# Patient Record
Sex: Male | Born: 1945
Health system: Southern US, Community
[De-identification: ages and names within clinical notes are randomized; demographics above are authoritative.]

## PROBLEM LIST (undated history)

## (undated) DIAGNOSIS — K219 Gastro-esophageal reflux disease without esophagitis: Secondary | ICD-10-CM

## (undated) DIAGNOSIS — I10 Essential (primary) hypertension: Secondary | ICD-10-CM

## (undated) HISTORY — PX: NEPHRECTOMY: SHX65

---

## 2005-12-04 ENCOUNTER — Ambulatory Visit: Payer: Self-pay | Admitting: Internal Medicine

## 2005-12-07 ENCOUNTER — Ambulatory Visit: Payer: Self-pay | Admitting: Internal Medicine

## 2009-12-17 ENCOUNTER — Inpatient Hospital Stay: Payer: Self-pay | Admitting: Internal Medicine

## 2011-01-27 ENCOUNTER — Observation Stay: Payer: Self-pay | Admitting: Internal Medicine

## 2012-11-22 ENCOUNTER — Ambulatory Visit: Payer: Self-pay | Admitting: Internal Medicine

## 2013-09-07 ENCOUNTER — Emergency Department: Payer: Self-pay | Admitting: Emergency Medicine

## 2014-12-25 DIAGNOSIS — M79646 Pain in unspecified finger(s): Secondary | ICD-10-CM | POA: Diagnosis not present

## 2014-12-25 DIAGNOSIS — M109 Gout, unspecified: Secondary | ICD-10-CM | POA: Diagnosis not present

## 2015-05-19 IMAGING — CR CERVICAL SPINE - COMPLETE 4+ VIEW
1 series · 7 of 7 positions shown · non-contrast
Comparison: none

REASON FOR EXAM: neck pain
COMMENTS:

[Series 1: w cervical spine lat · 0.14mm/px · 7 of 7 slices shown]
[im 1/7]
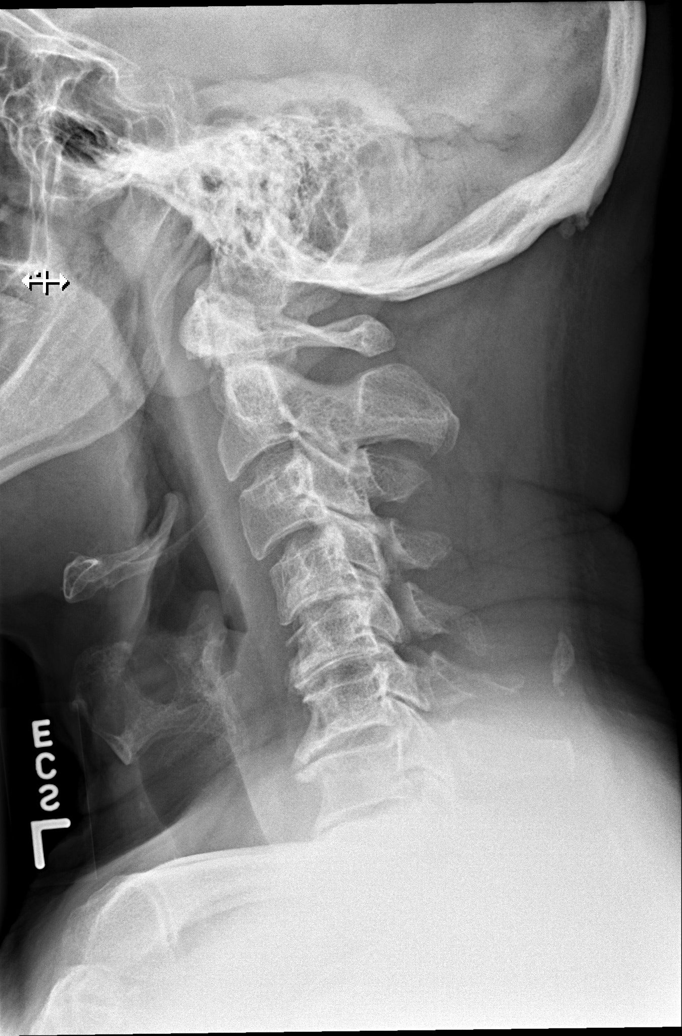
[im 2/7]
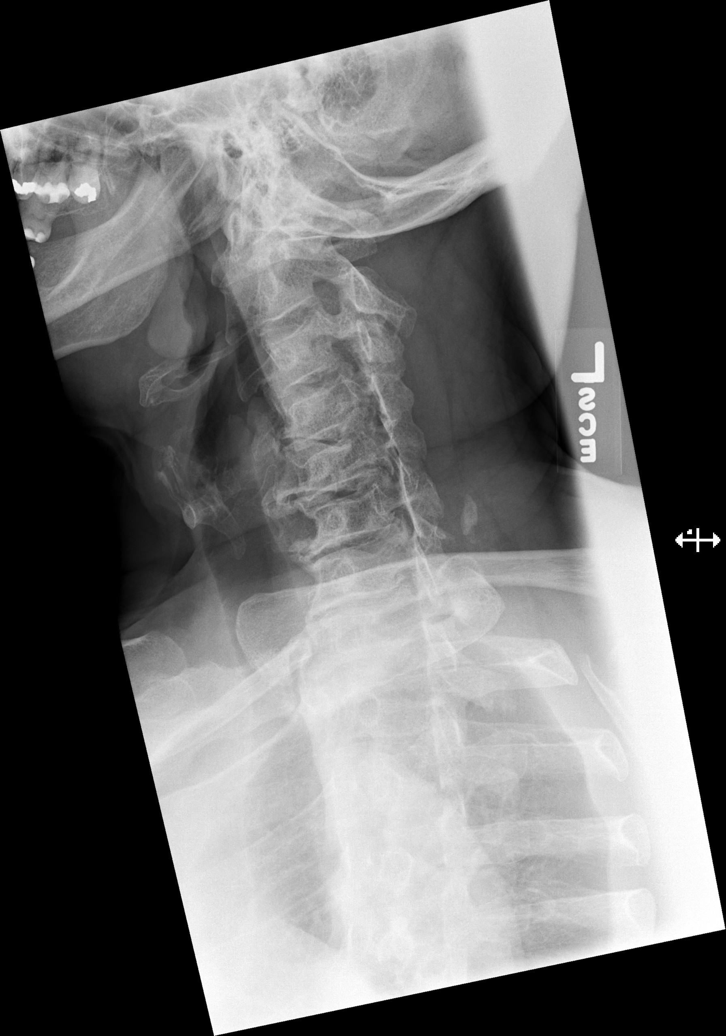
[im 3/7]
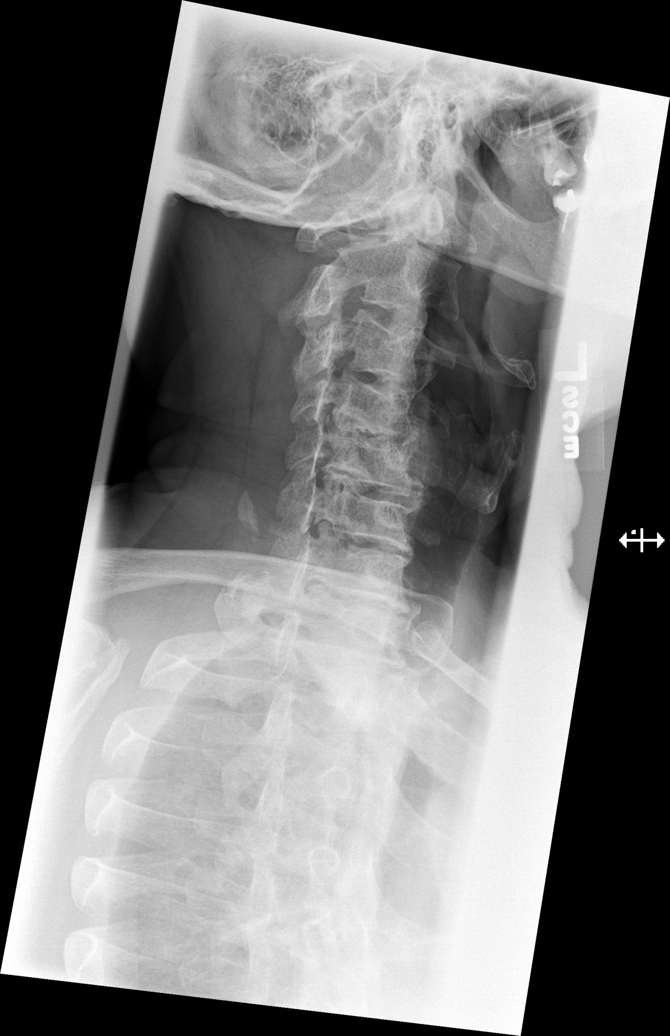
[im 4/7]
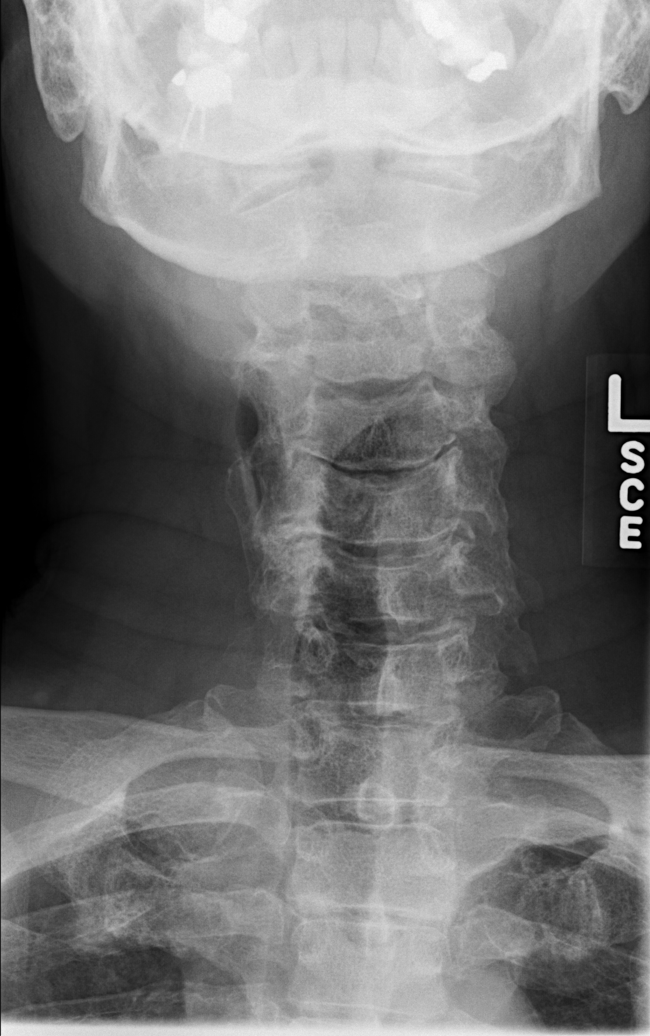
[im 5/7]
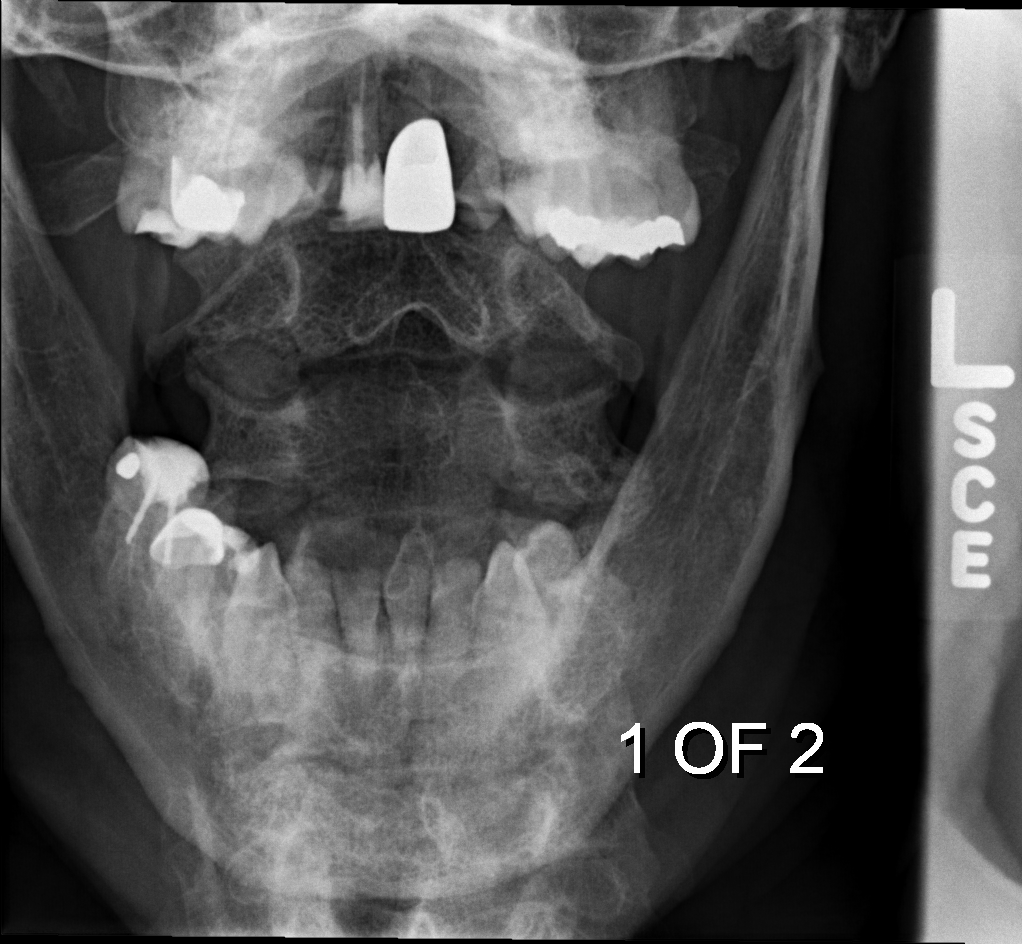
[im 6/7]
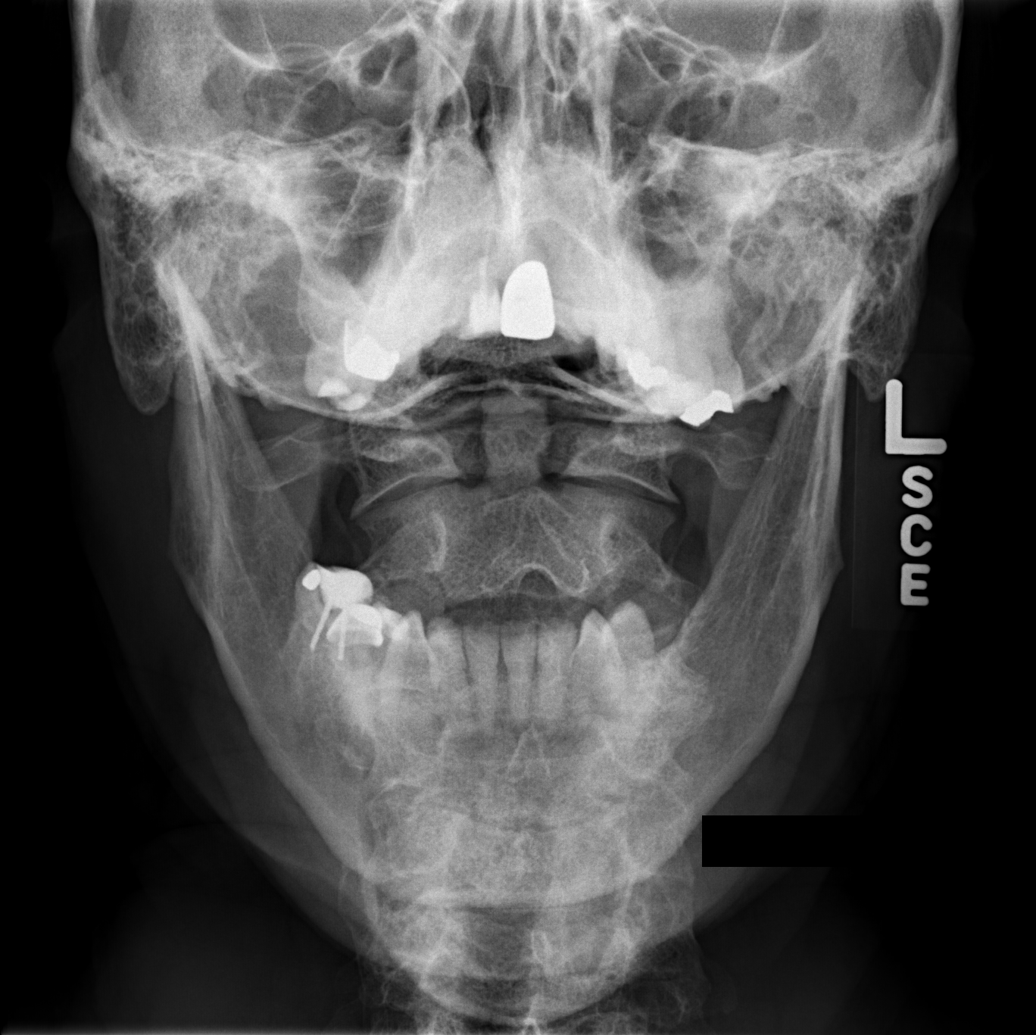
[im 7/7]
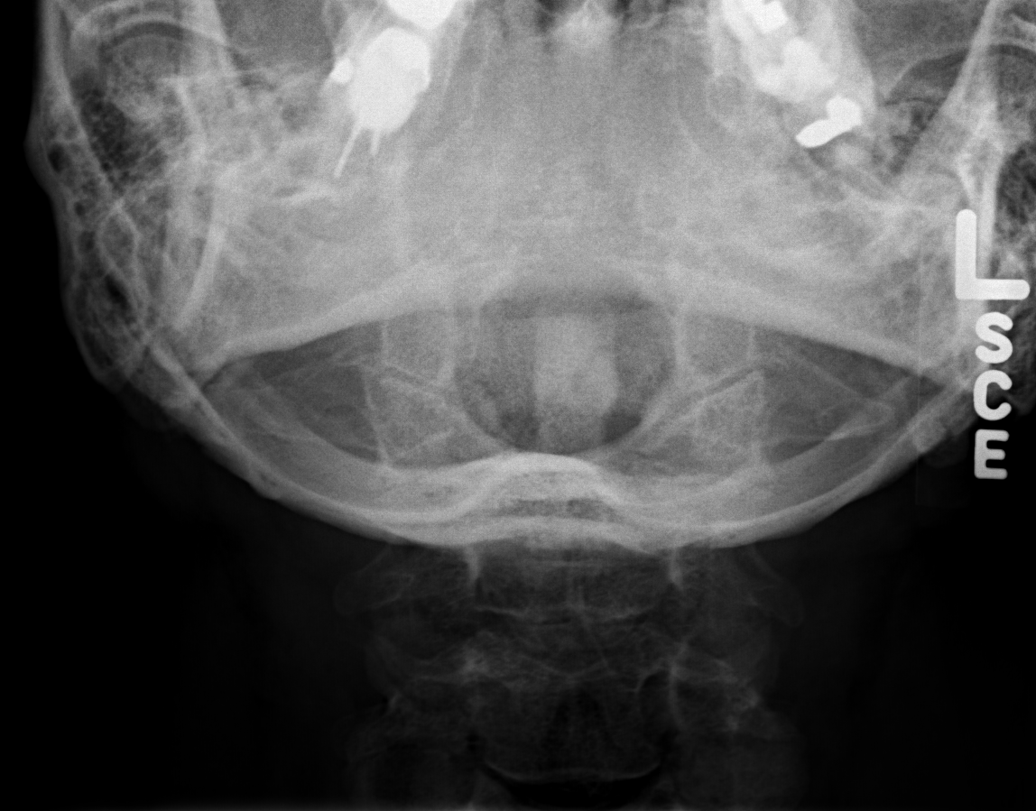

[7 of 7 positions shown; findings below may reference images not displayed]

PROCEDURE:     DXR - DXR CERVICAL SPINE COMPLETE  - November 22, 2012 [DATE]

RESULT:     There is loss of the normal cervical lordosis. There is
intervertebral disc narrowing at C5-C6 and C6-C7 as well as at C7-T1. There
is some soft tissue calcification posterior to the C6 level. The
prevertebral soft tissues are normal. Degenerative endplate spurring is
seen. Uncinate spurring is seen at multiple levels bilaterally causing
foraminal encroachment especially on the left at C4-C5 and C6. Facet
arthropathy is present. The odontoid is intact. The atlantoaxial alignment
is maintained.
IMPRESSION: Degenerative changes in the cervical spine with some bony
encroachment on the foramina. Loss of the normal cervical lordosis. No acute
bony abnormality evident.

[REDACTED]

## 2016-01-03 DIAGNOSIS — N529 Male erectile dysfunction, unspecified: Secondary | ICD-10-CM | POA: Diagnosis not present

## 2016-01-03 DIAGNOSIS — I10 Essential (primary) hypertension: Secondary | ICD-10-CM | POA: Diagnosis not present

## 2016-01-03 DIAGNOSIS — M47812 Spondylosis without myelopathy or radiculopathy, cervical region: Secondary | ICD-10-CM | POA: Diagnosis not present

## 2016-04-24 ENCOUNTER — Emergency Department
Admission: EM | Admit: 2016-04-24 | Discharge: 2016-04-24 | Disposition: A | Discharge status: Home / Self Care | Payer: Commercial Managed Care - HMO | Attending: Emergency Medicine | Admitting: Emergency Medicine

## 2016-04-24 ENCOUNTER — Emergency Department: Payer: Commercial Managed Care - HMO

## 2016-04-24 DIAGNOSIS — Y929 Unspecified place or not applicable: Secondary | ICD-10-CM | POA: Insufficient documentation

## 2016-04-24 DIAGNOSIS — I10 Essential (primary) hypertension: Secondary | ICD-10-CM | POA: Diagnosis not present

## 2016-04-24 DIAGNOSIS — Y9389 Activity, other specified: Secondary | ICD-10-CM | POA: Diagnosis not present

## 2016-04-24 DIAGNOSIS — Y999 Unspecified external cause status: Secondary | ICD-10-CM | POA: Diagnosis not present

## 2016-04-24 DIAGNOSIS — S46912A Strain of unspecified muscle, fascia and tendon at shoulder and upper arm level, left arm, initial encounter: Secondary | ICD-10-CM

## 2016-04-24 DIAGNOSIS — M25512 Pain in left shoulder: Secondary | ICD-10-CM | POA: Diagnosis not present

## 2016-04-24 DIAGNOSIS — X58XXXA Exposure to other specified factors, initial encounter: Secondary | ICD-10-CM | POA: Insufficient documentation

## 2016-04-24 DIAGNOSIS — S4992XA Unspecified injury of left shoulder and upper arm, initial encounter: Secondary | ICD-10-CM | POA: Diagnosis present

## 2016-04-24 HISTORY — DX: Essential (primary) hypertension: I10

## 2016-04-24 NOTE — ED Notes (Signed)
Patient reports changing a tire Friday and injuring left shoulder. Pt reports pain has been steadily increasing since initial injury. Patient also c/o decrease in range of motion of shoulder - patient reports he cannot lift arm past 30 degree angle

## 2016-04-24 NOTE — ED Notes (Signed)
MD Forbach at bedside. 

## 2016-04-24 NOTE — ED Notes (Signed)
Reviewed d/c instructions, follow-up care with patient. Pt verbalized understanding.  

## 2016-04-24 NOTE — ED Provider Notes (Signed)
[facility] Emergency Department Provider Note  ____________________________________________   First MD Initiated Contact with Patient 04/24/16 0543     (approximate)  I have reviewed the triage vital signs and the nursing notes.   HISTORY  Chief Complaint Shoulder Pain    HPI Adrian Blevins is a 70 y.o. male who presents for evaluation of acute pain in his left shoulder.  It started gradually about 2 days ago after he strained to move the lug nuts on a tire.  He did not have a particular moment where the pain began suddenly, but it is gradually gotten worse over the last 2 days.  He reports severe sharp pain when he attempts to raise his arm (abduct his shoulder).  He has no numbness nor tingling.  The pain does not radiate to his chest or back.  He had no specific trauma or injury.  He denies fever/chills, chest pain, shortness of breath, nausea, vomiting, abdominal pain.  He has normal use of his hand and his elbow with no pain in any other joint.   Past Medical History:  Diagnosis Date  . Hypertension     There are no active problems to display for this patient.   Past Surgical History:  Procedure Laterality Date  . NEPHRECTOMY Left     Prior to Admission medications   Not on File    Allergies Patient has no known allergies.  No family history on file.  Social History Social History  Substance Use Topics  . Smoking status: Not on file  . Smokeless tobacco: Not on file  . Alcohol use Not on file    Review of Systems Constitutional: No fever/chills Cardiovascular: Denies chest pain. Respiratory: Denies shortness of breath. Musculoskeletal: AcutNegative for back pain. Skin: Negative for rash. Neurological: Negative for headaches, focal weakness or numbness.   ____________________________________________   PHYSICAL EXAM:  VITAL SIGNS: ED Triage Vitals  Enc Vitals Group     BP 04/24/16 0445 (!) 148/88     Pulse Rate  04/24/16 0445 89     Resp 04/24/16 0445 16     Temp 04/24/16 0445 97.9 F (36.6 C)     Temp Source 04/24/16 0445 Oral     SpO2 04/24/16 0445 96 %     Weight 04/24/16 0443 205 lb (93 kg)     Height 04/24/16 0443 5\' 8"  (1.727 m)     Head Circumference --      Peak Flow --      Pain Score 04/24/16 0444 8     Pain Loc --      Pain Edu? --      Excl. in GC? --     Constitutional: Alert and oriented. Well appearing and in no acute distress. Head: Atraumatic. Mouth/Throat: Mucous membranes are moist.  Oropharynx non-erythematous. Cardiovascular: Normal rate, regular rhythm. Good peripheral circulation.  Respiratory: Normal respiratory effort.  No retractions.  Gastrointestinal: Soft and nontender. No distention.  Musculoskeletal: No gross deformity of his left upper extremity.  He has point tenderness to palpation of the left deltoid and around the [facility] joint.  No edema/effusion.  Patient has normal flexion/extension of elbow, no neurological deficits, good grip strength.  Able to reach across to right shoulder with minimal pain.  Pain is specific to shoulder abduction. Skin:  Skin is warm, dry and intact. No rash noted. Psychiatric: Mood and affect are normal. Speech and behavior are normal.  ____________________________________________   LABS (all labs ordered are listed,  but only abnormal results are displayed)  Labs Reviewed - No data to display ____________________________________________  EKG  No EKG ordered ____________________________________________  RADIOLOGY   Dg Shoulder Left  Result Date: 04/24/2016 CLINICAL DATA:  Left shoulder pain EXAM: LEFT SHOULDER - 2+ VIEW COMPARISON:  None. FINDINGS: There is no acute fracture or dislocation of the left shoulder. There is osteophytosis of the acromioclavicular joint. The glenohumeral joint is approximated. No focal bone lesions. IMPRESSION: 1. No acute fracture or dislocation of the left shoulder. 2. Acromioclavicular  osteoarthrosis. Electronically Signed   By: Deatra RobinsonKevin  Herman M.D.   On: 04/24/2016 05:32    ____________________________________________   PROCEDURES  Procedure(s) performed:   Procedures   Critical Care performed: No ____________________________________________   INITIAL IMPRESSION / ASSESSMENT AND PLAN / ED COURSE  Pertinent labs & imaging results that were available during my care of the patient were reviewed by me and considered in my medical decision making (see chart for details).  Sling for comfort but encouraged ROM as tolerated.  NSAIDs, heat therapy, ortho follow up    ____________________________________________  FINAL CLINICAL IMPRESSION(S) / ED DIAGNOSES  Final diagnoses:  Strain of left shoulder, initial encounter     MEDICATIONS GIVEN DURING THIS VISIT:  Medications - No data to display   NEW OUTPATIENT MEDICATIONS STARTED DURING THIS VISIT:  New Prescriptions   No medications on file    Modified Medications   No medications on file    Discontinued Medications   No medications on file     Note:  This document was prepared using Dragon voice recognition software and may include unintentional dictation errors.    Loleta Roseory Alishia Lebo, MD 04/24/16 [phone]

## 2016-04-24 NOTE — ED Triage Notes (Signed)
Patient presents with left shoulder pain. States he is unsure if it's a rotator cuff injury or not. Had changed a tire on Friday and started hurting a little while after that. Tonight states he cannot raise his arm due to pain.

## 2016-04-24 NOTE — Discharge Instructions (Signed)
As we discussed, your x-rays are reassuring, but you may have a strain or sprain of your shoulder.  We provided you with a sling, but encourage you use the arm as much as you are able.  Read through the included information about other recommendations including heat therapy.  Follow up with Dr. Ernest PineHooten or one of his colleagues in about a week if your shoulder has not improved.  Take over-the-counter ibuprofen and Tylenol as needed for pain.

## 2016-06-08 DIAGNOSIS — I10 Essential (primary) hypertension: Secondary | ICD-10-CM | POA: Diagnosis not present

## 2016-06-08 DIAGNOSIS — N183 Chronic kidney disease, stage 3 (moderate): Secondary | ICD-10-CM | POA: Diagnosis not present

## 2016-06-08 DIAGNOSIS — R809 Proteinuria, unspecified: Secondary | ICD-10-CM | POA: Diagnosis not present

## 2016-06-08 DIAGNOSIS — R600 Localized edema: Secondary | ICD-10-CM | POA: Diagnosis not present

## 2016-08-01 ENCOUNTER — Encounter: Payer: Self-pay | Admitting: *Deleted

## 2016-08-01 ENCOUNTER — Emergency Department: Payer: Medicare HMO

## 2016-08-01 ENCOUNTER — Inpatient Hospital Stay
Admission: EM | Admit: 2016-08-01 | Discharge: 2016-08-03 | DRG: 281 | Disposition: A | Discharge status: Home / Self Care | Payer: Medicare HMO | Attending: Specialist | Admitting: Specialist

## 2016-08-01 DIAGNOSIS — K219 Gastro-esophageal reflux disease without esophagitis: Secondary | ICD-10-CM | POA: Diagnosis not present

## 2016-08-01 DIAGNOSIS — Z7982 Long term (current) use of aspirin: Secondary | ICD-10-CM | POA: Diagnosis not present

## 2016-08-01 DIAGNOSIS — N183 Chronic kidney disease, stage 3 (moderate): Secondary | ICD-10-CM | POA: Diagnosis not present

## 2016-08-01 DIAGNOSIS — I209 Angina pectoris, unspecified: Secondary | ICD-10-CM | POA: Diagnosis not present

## 2016-08-01 DIAGNOSIS — Z23 Encounter for immunization: Secondary | ICD-10-CM

## 2016-08-01 DIAGNOSIS — Z8249 Family history of ischemic heart disease and other diseases of the circulatory system: Secondary | ICD-10-CM

## 2016-08-01 DIAGNOSIS — R0789 Other chest pain: Secondary | ICD-10-CM | POA: Diagnosis not present

## 2016-08-01 DIAGNOSIS — Z85528 Personal history of other malignant neoplasm of kidney: Secondary | ICD-10-CM | POA: Diagnosis not present

## 2016-08-01 DIAGNOSIS — R079 Chest pain, unspecified: Secondary | ICD-10-CM | POA: Diagnosis present

## 2016-08-01 DIAGNOSIS — I129 Hypertensive chronic kidney disease with stage 1 through stage 4 chronic kidney disease, or unspecified chronic kidney disease: Secondary | ICD-10-CM | POA: Diagnosis not present

## 2016-08-01 DIAGNOSIS — Z6831 Body mass index (BMI) 31.0-31.9, adult: Secondary | ICD-10-CM

## 2016-08-01 DIAGNOSIS — E669 Obesity, unspecified: Secondary | ICD-10-CM | POA: Diagnosis present

## 2016-08-01 DIAGNOSIS — Z905 Acquired absence of kidney: Secondary | ICD-10-CM | POA: Diagnosis not present

## 2016-08-01 DIAGNOSIS — N184 Chronic kidney disease, stage 4 (severe): Secondary | ICD-10-CM | POA: Diagnosis not present

## 2016-08-01 DIAGNOSIS — N1330 Unspecified hydronephrosis: Secondary | ICD-10-CM | POA: Diagnosis not present

## 2016-08-01 DIAGNOSIS — N179 Acute kidney failure, unspecified: Secondary | ICD-10-CM | POA: Diagnosis present

## 2016-08-01 DIAGNOSIS — E876 Hypokalemia: Secondary | ICD-10-CM | POA: Diagnosis present

## 2016-08-01 DIAGNOSIS — I10 Essential (primary) hypertension: Secondary | ICD-10-CM | POA: Diagnosis present

## 2016-08-01 DIAGNOSIS — I160 Hypertensive urgency: Secondary | ICD-10-CM | POA: Diagnosis present

## 2016-08-01 DIAGNOSIS — I214 Non-ST elevation (NSTEMI) myocardial infarction: Secondary | ICD-10-CM | POA: Diagnosis present

## 2016-08-01 HISTORY — DX: Gastro-esophageal reflux disease without esophagitis: K21.9

## 2016-08-01 LAB — CBC
HCT: 45.7 % (ref 40.0–52.0)
HEMOGLOBIN: 15.7 g/dL (ref 13.0–18.0)
MCH: 31.1 pg (ref 26.0–34.0)
MCHC: 34.3 g/dL (ref 32.0–36.0)
MCV: 90.8 fL (ref 80.0–100.0)
Platelets: 178 10*3/uL (ref 150–440)
RBC: 5.03 MIL/uL (ref 4.40–5.90)
RDW: 15.2 % — ABNORMAL HIGH (ref 11.5–14.5)
WBC: 8.8 10*3/uL (ref 3.8–10.6)

## 2016-08-01 LAB — BASIC METABOLIC PANEL
ANION GAP: 10 (ref 5–15)
BUN: 33 mg/dL — ABNORMAL HIGH (ref 6–20)
CALCIUM: 9.5 mg/dL (ref 8.9–10.3)
CO2: 28 mmol/L (ref 22–32)
Chloride: 101 mmol/L (ref 101–111)
Creatinine, Ser: 3.28 mg/dL — ABNORMAL HIGH (ref 0.61–1.24)
GFR, EST AFRICAN AMERICAN: 21 mL/min — AB (ref >60)
GFR, EST NON AFRICAN AMERICAN: 18 mL/min — AB (ref >60)
GLUCOSE: 118 mg/dL — AB (ref 65–99)
POTASSIUM: 3.2 mmol/L — AB (ref 3.5–5.1)
SODIUM: 139 mmol/L (ref 135–145)

## 2016-08-01 LAB — TROPONIN I

## 2016-08-01 MED ORDER — LABETALOL HCL 5 MG/ML IV SOLN
5.0000 mg | Freq: Once | INTRAVENOUS | Status: AC
  Administered 2016-08-01: 5 mg via INTRAVENOUS

## 2016-08-01 MED ORDER — GI COCKTAIL ~~LOC~~
30.0000 mL | Freq: Once | ORAL | Status: AC
  Administered 2016-08-01: 30 mL via ORAL
  Filled 2016-08-01: qty 30

## 2016-08-01 MED ORDER — LABETALOL HCL 5 MG/ML IV SOLN
5.0000 mg | Freq: Once | INTRAVENOUS | Status: AC
  Administered 2016-08-01: 5 mg via INTRAVENOUS
  Filled 2016-08-01: qty 4

## 2016-08-01 NOTE — ED Notes (Signed)
Patient transported to X-ray 

## 2016-08-01 NOTE — ED Provider Notes (Signed)
[facility] Emergency Department Provider Note    First MD Initiated Contact with Patient 08/01/16 2116     (approximate)  I have reviewed the triage vital signs and the nursing notes.   HISTORY  Chief Complaint Chest Pain    HPI Adrian Blevins is a 71 y.o. male with a history of high blood pressure as well as 1 time nephrectomy presents with worsening epigastric burning pain. This started roughly around eating dinner tonight.  Describes it as a burning pressure and sensation. Denies any shortness of breath. Took some Mylanta with some improvement. States he's never had a significant cardiac workup before. Not currently on dialysis. Denies any worsening orthopnea. No cough or fevers.   Past Medical History:  Diagnosis Date  . GERD (gastroesophageal reflux disease)   . Hypertension    Family History  Problem Relation Age of Onset  . Hypertension Other    Past Surgical History:  Procedure Laterality Date  . NEPHRECTOMY Left    Patient Active Problem List   Diagnosis Date Noted  . Chest pain 08/01/2016  . Accelerated hypertension 08/01/2016  . GERD (gastroesophageal reflux disease) 08/01/2016  . AKI (acute kidney injury) (HCC) 08/01/2016      Prior to Admission medications   Medication Sig Start Date End Date Taking? Authorizing Provider  aspirin EC 81 MG tablet Take 81 mg by mouth daily.   Yes Historical Provider, MD  Multiple Vitamin (MULTIVITAMIN WITH MINERALS) TABS tablet Take 1 tablet by mouth daily.   Yes Historical Provider, MD  NIFEdipine (PROCARDIA XL/ADALAT-CC) 90 MG 24 hr tablet Take 90 mg by mouth daily.   Yes Historical Provider, MD  ramipril (ALTACE) 10 MG capsule Take 10 mg by mouth daily.   Yes Historical Provider, MD    Allergies Patient has no known allergies.    Social History Social History  Substance Use Topics  . Smoking status: Never Smoker  . Smokeless tobacco: Never Used  . Alcohol use No    Review of  Systems Patient denies headaches, rhinorrhea, blurry vision, numbness, shortness of breath, chest pain, edema, cough, abdominal pain, nausea, vomiting, diarrhea, dysuria, fevers, rashes or hallucinations unless otherwise stated above in HPI. ____________________________________________   PHYSICAL EXAM:  VITAL SIGNS: Vitals:   08/01/16 2240 08/01/16 2250  BP: (!) 197/99 (!) 188/95  Pulse: 80 83  Resp: 16 12  Temp:      Constitutional: Alert and oriented. Well appearing and in no acute distress. Eyes: Conjunctivae are normal. PERRL. EOMI. Head: Atraumatic. Nose: No congestion/rhinnorhea. Mouth/Throat: Mucous membranes are moist.  Oropharynx non-erythematous. Neck: No stridor. Painless ROM. No cervical spine tenderness to palpation Hematological/Lymphatic/Immunilogical: No cervical lymphadenopathy. Cardiovascular: Normal rate, regular rhythm. Grossly normal heart sounds.  Good peripheral circulation. Respiratory: Normal respiratory effort.  No retractions. Lungs CTAB. Gastrointestinal: Soft and nontender. No distention. No abdominal bruits. No CVA tenderness. Genitourinary:  Musculoskeletal: No lower extremity tenderness nor edema.  No joint effusions. Neurologic:  Normal speech and language. No gross focal neurologic deficits are appreciated. No gait instability. Skin:  Skin is warm, dry and intact. No rash noted. Psychiatric: Mood and affect are normal. Speech and behavior are normal.  ____________________________________________   LABS (all labs ordered are listed, but only abnormal results are displayed)  Results for orders placed or performed during the hospital encounter of 08/01/16 (from the past 24 hour(s))  Basic metabolic panel     Status: Abnormal   Collection Time: 08/01/16  8:59 PM  Result Value  Ref Range   Sodium 139 135 - 145 mmol/L   Potassium 3.2 (L) 3.5 - 5.1 mmol/L   Chloride 101 101 - 111 mmol/L   CO2 28 22 - 32 mmol/L   Glucose, Bld 118 (H) 65 - 99 mg/dL    BUN 33 (H) 6 - 20 mg/dL   Creatinine, Ser 9.60 (H) 0.61 - 1.24 mg/dL   Calcium 9.5 8.9 - 45.4 mg/dL   GFR calc non Af Amer 18 (L) >60 mL/min   GFR calc Af Amer 21 (L) >60 mL/min   Anion gap 10 5 - 15  CBC     Status: Abnormal   Collection Time: 08/01/16  8:59 PM  Result Value Ref Range   WBC 8.8 3.8 - 10.6 K/uL   RBC 5.03 4.40 - 5.90 MIL/uL   Hemoglobin 15.7 13.0 - 18.0 g/dL   HCT 09.8 11.9 - 14.7 %   MCV 90.8 80.0 - 100.0 fL   MCH 31.1 26.0 - 34.0 pg   MCHC 34.3 32.0 - 36.0 g/dL   RDW 82.9 (H) 56.2 - 13.0 %   Platelets 178 150 - 440 K/uL  Troponin I     Status: None   Collection Time: 08/01/16  8:59 PM  Result Value Ref Range   Troponin I <0.03 <0.03 ng/mL   ____________________________________________  EKG My review and personal interpretation at Time: 20:51   Indication: chest pain  Rate: 98  Rhythm: sinus Axis: normal Other: non specific st changes, down sloping st depressions in inferolateral leads. no STEMI ____________________________________________  RADIOLOGY  I personally reviewed all radiographic images ordered to evaluate for the above acute complaints and reviewed radiology reports and findings.  These findings were personally discussed with the patient.  Please see medical record for radiology report.  ____________________________________________   PROCEDURES  Procedure(s) performed:  Procedures    Critical Care performed: no ____________________________________________   INITIAL IMPRESSION / ASSESSMENT AND PLAN / ED COURSE  Pertinent labs & imaging results that were available during my care of the patient were reviewed by me and considered in my medical decision making (see chart for details).  ACS, pericarditis, esophagitis, boerhaaves, pe, dissection, pna, bronchitis, costochondritis   TOA MIA is a 71 y.o. who presents to the ED with hypertension as well as burning chest pain and epigastric pain. Patient's presentation complicated  by a solitary kidney. Chest pain is not reproducible. EKG was nonspecific changes and initially troponin is negative. His abdominal exam is soft and benign. Do not feel this is consistent with evidence of Boerhaave's, pneumonia or dissection. Patient with some improvement after GI cocktail however given his marked hypertension requiring multiple doses of IV antihypertensives I am concerned for underlying anginal clumping his epigastric pain. States that he's had cardiac workup but this was several years ago as an outpatient. Patient is at high risk for coronary disease therefore do feel it would be prudent to admit the patient for serial enzymes and further risk stratification. This also provide time to manage his blood pressure. States his kidney function is typically around 37 may not be acutely worsening.  Have discussed with the patient and available family all diagnostics and treatments performed thus far and all questions were answered to the best of my ability. The patient demonstrates understanding and agreement with plan.     ____________________________________________   FINAL CLINICAL IMPRESSION(S) / ED DIAGNOSES  Final diagnoses:  Atypical chest pain  Hypertensive urgency      NEW MEDICATIONS STARTED DURING  THIS VISIT:  New Prescriptions   No medications on file     Note:  This document was prepared using Dragon voice recognition software and may include unintentional dictation errors.    Willy Eddy, MD 08/01/16 2350

## 2016-08-01 NOTE — H&P (Signed)
[facility] [place] [facility]   PATIENT NAME: Adrian Blevins    MR#:  [id number]  DATE OF BIRTH:  19-Nov-1945  DATE OF ADMISSION:  08/01/2016  PRIMARY CARE PHYSICIAN: Adrian PillarSyed A Masood, MD   REQUESTING/REFERRING PHYSICIAN: Roxan Hockeyobinson, MD  CHIEF COMPLAINT:   Chief Complaint  Patient presents with  . Chest Pain    HISTORY OF PRESENT ILLNESS:  Adrian Blevins  is a 71 y.o. male who presents with An episode of chest pain and significantly elevated blood pressure. Patient states that he had an episode of central chest pain, nonradiating, not related with exertion, and self-limiting. He has not had any prior workup for heart disease and has no prior history of the same. He does have an extensive history of uncontrolled hypertension. Tonight his blood pressure was in the 210s systolic in the ED. Hospitalists were called for admission  PAST MEDICAL HISTORY:   Past Medical History:  Diagnosis Date  . GERD (gastroesophageal reflux disease)   . Hypertension     PAST SURGICAL HISTORY:   Past Surgical History:  Procedure Laterality Date  . NEPHRECTOMY Left     SOCIAL HISTORY:   Social History  Substance Use Topics  . Smoking status: Never Smoker  . Smokeless tobacco: Never Used  . Alcohol use No    FAMILY HISTORY:   Family History  Problem Relation Age of Onset  . Hypertension Other     DRUG ALLERGIES:  No Known Allergies  MEDICATIONS AT HOME:   Prior to Admission medications   Medication Sig Start Date End Date Taking? Authorizing Provider  NIFEdipine (PROCARDIA XL/ADALAT-CC) 90 MG 24 hr tablet Take 90 mg by mouth daily.   Yes Historical Provider, MD  ramipril (ALTACE) 10 MG capsule Take 10 mg by mouth daily.   Yes Historical Provider, MD    REVIEW OF SYSTEMS:  Review of Systems  Constitutional: Negative for chills, fever, malaise/fatigue and weight loss.  HENT: Negative for ear pain, hearing loss and tinnitus.   Eyes: Negative for blurred  vision, double vision, pain and redness.  Respiratory: Negative for cough, hemoptysis and shortness of breath.   Cardiovascular: Positive for chest pain. Negative for palpitations, orthopnea and leg swelling.  Gastrointestinal: Negative for abdominal pain, constipation, diarrhea, nausea and vomiting.  Genitourinary: Negative for dysuria, frequency and hematuria.  Musculoskeletal: Negative for back pain, joint pain and neck pain.  Skin:       No acne, rash, or lesions  Neurological: Negative for dizziness, tremors, focal weakness and weakness.  Endo/Heme/Allergies: Negative for polydipsia. Does not bruise/bleed easily.  Psychiatric/Behavioral: Negative for depression. The patient is not nervous/anxious and does not have insomnia.      VITAL SIGNS:   Vitals:   08/01/16 2220 08/01/16 2230 08/01/16 2240 08/01/16 2250  BP: (!) 200/103 (!) 199/102 (!) 197/99 (!) 188/95  Pulse: 78 81 80 83  Resp: 18 17 16 12   Temp:      TempSrc:      SpO2: 94% 95% 96% 96%  Weight:      Height:       Wt Readings from Last 3 Encounters:  08/01/16 93 kg (205 lb)  04/24/16 93 kg (205 lb)    PHYSICAL EXAMINATION:  Physical Exam  Vitals reviewed. Constitutional: He is oriented to person, place, and time. He appears well-developed and well-nourished. No distress.  HENT:  Head: Normocephalic and atraumatic.  Mouth/Throat: Oropharynx is clear and moist.  Eyes: Conjunctivae and EOM are normal. Pupils  are equal, round, and reactive to light. No scleral icterus.  Neck: Normal range of motion. Neck supple. No JVD present. No thyromegaly present.  Cardiovascular: Normal rate, regular rhythm and intact distal pulses.  Exam reveals no gallop and no friction rub.   No murmur heard. Respiratory: Effort normal and breath sounds normal. No respiratory distress. He has no wheezes. He has no rales.  GI: Soft. Bowel sounds are normal. He exhibits no distension. There is no tenderness.  Musculoskeletal: Normal range of  motion. He exhibits no edema.  No arthritis, no gout  Lymphadenopathy:    He has no cervical adenopathy.  Neurological: He is alert and oriented to person, place, and time. No cranial nerve deficit.  No dysarthria, no aphasia  Skin: Skin is warm and dry. No rash noted. No erythema.  Psychiatric: He has a normal mood and affect. His behavior is normal. Judgment and thought content normal.    LABORATORY PANEL:   CBC  Recent Labs Lab 08/01/16 2059  WBC 8.8  HGB 15.7  HCT 45.7  PLT 178   ------------------------------------------------------------------------------------------------------------------  Chemistries   Recent Labs Lab 08/01/16 2059  NA 139  K 3.2*  CL 101  CO2 28  GLUCOSE 118*  BUN 33*  CREATININE 3.28*  CALCIUM 9.5   ------------------------------------------------------------------------------------------------------------------  Cardiac Enzymes  Recent Labs Lab 08/01/16 2059  TROPONINI <0.03   ------------------------------------------------------------------------------------------------------------------  RADIOLOGY:  Dg Chest 2 View  Result Date: 08/01/2016 CLINICAL DATA:  Midsternal chest pain. EXAM: CHEST  2 VIEW COMPARISON:  01/27/2011 FINDINGS: Unchanged heart size and mediastinal contours with thoracic aortic tortuosity. No pulmonary edema. No consolidation, pleural fluid or pneumothorax. Probable scarring anteriorly in the lingula. Minimal bronchial thickening. No acute osseous abnormalities are seen. IMPRESSION: Bronchial thickening, may be infectious or inflammatory. Electronically Signed   By: Rubye Oaks M.D.   On: 08/01/2016 21:37    EKG:   Orders placed or performed during the hospital encounter of 08/01/16  . EKG 12-Lead  . EKG 12-Lead  . ED EKG within 10 minutes  . ED EKG within 10 minutes    IMPRESSION AND PLAN:  Principal Problem:   Chest pain - we will trend his cardiac enzymes tonight, and get an echocardiogram and  cardiology consult in the morning. Active Problems:   Accelerated hypertension - blood pressure has been reduced initially by about 20% in the ED. We will continue with antihypertensives with a goal of reducing his blood pressure to less than 160/100 over the next 24 hours.   AKI (acute kidney injury) (HCC) - unclear if there is a chronic component to this renal disease, but his creatinine is greater than 3 tonight in the ED. We will consult nephrology.   GERD (gastroesophageal reflux disease) - home dose PPI  All the records are reviewed and case discussed with ED provider. Management plans discussed with the patient and/or family.  DVT PROPHYLAXIS: SubQ heparin  GI PROPHYLAXIS: PPI  ADMISSION STATUS: Observation  CODE STATUS: Full Code Status History    This patient does not have a recorded code status. Please follow your organizational policy for patients in this situation.      TOTAL TIME TAKING CARE OF THIS PATIENT: 40 minutes.    Lola Lofaro FIELDING 08/01/2016, 11:45 PM  [facility]  Office  [phone]  CC: Primary care physician; Adrian Pillar, MD

## 2016-08-01 NOTE — ED Triage Notes (Signed)
Pt states hx of acid reflux, does not take meds daily for this. Pt took a Nexium PO OTC tonight prior to eating tonight. Pt states "a little burning" but denies onset of chest pain. Pt presents w/ c/o mid-substernal chest that is not reproducible. Pt c/o increased belching. Pt denies associated sxs, is hypertensive in triage.

## 2016-08-01 NOTE — ED Notes (Addendum)
Epigastric chest pain that began this afternoon while at rest. Hx of reflux, pt took nexium PTA without much relief. Pt alert and oriented X4, active, cooperative, pt in NAD. RR even and unlabored, color WNL.   Pt hx of hypertension, no recent change in medications. Pt compliant with medications.

## 2016-08-01 NOTE — ED Notes (Signed)
EDP informed of repeat VS after labetalol given.

## 2016-08-02 ENCOUNTER — Observation Stay
Admit: 2016-08-02 | Discharge: 2016-08-02 | Disposition: A | Discharge status: Home / Self Care | Payer: Medicare HMO | Attending: Internal Medicine | Admitting: Internal Medicine

## 2016-08-02 ENCOUNTER — Observation Stay: Payer: Medicare HMO

## 2016-08-02 DIAGNOSIS — Z7982 Long term (current) use of aspirin: Secondary | ICD-10-CM | POA: Diagnosis not present

## 2016-08-02 DIAGNOSIS — I214 Non-ST elevation (NSTEMI) myocardial infarction: Secondary | ICD-10-CM | POA: Diagnosis present

## 2016-08-02 DIAGNOSIS — Z6831 Body mass index (BMI) 31.0-31.9, adult: Secondary | ICD-10-CM | POA: Diagnosis not present

## 2016-08-02 DIAGNOSIS — N1330 Unspecified hydronephrosis: Secondary | ICD-10-CM | POA: Diagnosis not present

## 2016-08-02 DIAGNOSIS — Z8249 Family history of ischemic heart disease and other diseases of the circulatory system: Secondary | ICD-10-CM | POA: Diagnosis not present

## 2016-08-02 DIAGNOSIS — K219 Gastro-esophageal reflux disease without esophagitis: Secondary | ICD-10-CM | POA: Diagnosis present

## 2016-08-02 DIAGNOSIS — R0789 Other chest pain: Secondary | ICD-10-CM | POA: Diagnosis present

## 2016-08-02 DIAGNOSIS — Z905 Acquired absence of kidney: Secondary | ICD-10-CM | POA: Diagnosis not present

## 2016-08-02 DIAGNOSIS — E876 Hypokalemia: Secondary | ICD-10-CM | POA: Diagnosis present

## 2016-08-02 DIAGNOSIS — I129 Hypertensive chronic kidney disease with stage 1 through stage 4 chronic kidney disease, or unspecified chronic kidney disease: Secondary | ICD-10-CM | POA: Diagnosis present

## 2016-08-02 DIAGNOSIS — Z85528 Personal history of other malignant neoplasm of kidney: Secondary | ICD-10-CM | POA: Diagnosis not present

## 2016-08-02 DIAGNOSIS — Z23 Encounter for immunization: Secondary | ICD-10-CM | POA: Diagnosis present

## 2016-08-02 DIAGNOSIS — E669 Obesity, unspecified: Secondary | ICD-10-CM | POA: Diagnosis present

## 2016-08-02 DIAGNOSIS — I160 Hypertensive urgency: Secondary | ICD-10-CM | POA: Diagnosis present

## 2016-08-02 DIAGNOSIS — N179 Acute kidney failure, unspecified: Secondary | ICD-10-CM | POA: Diagnosis present

## 2016-08-02 DIAGNOSIS — N184 Chronic kidney disease, stage 4 (severe): Secondary | ICD-10-CM | POA: Diagnosis present

## 2016-08-02 LAB — URINALYSIS, ROUTINE W REFLEX MICROSCOPIC
BILIRUBIN URINE: NEGATIVE
Bacteria, UA: NONE SEEN
Glucose, UA: 50 mg/dL — AB
Hgb urine dipstick: NEGATIVE
KETONES UR: NEGATIVE mg/dL
LEUKOCYTES UA: NEGATIVE
Nitrite: NEGATIVE
PH: 6 (ref 5.0–8.0)
SQUAMOUS EPITHELIAL / LPF: NONE SEEN
Specific Gravity, Urine: 1.011 (ref 1.005–1.030)

## 2016-08-02 LAB — BASIC METABOLIC PANEL
Anion gap: 11 (ref 5–15)
BUN: 31 mg/dL — AB (ref 6–20)
CALCIUM: 8.7 mg/dL — AB (ref 8.9–10.3)
CO2: 28 mmol/L (ref 22–32)
CREATININE: 2.96 mg/dL — AB (ref 0.61–1.24)
Chloride: 102 mmol/L (ref 101–111)
GFR calc Af Amer: 23 mL/min — ABNORMAL LOW (ref >60)
GFR, EST NON AFRICAN AMERICAN: 20 mL/min — AB (ref >60)
Glucose, Bld: 105 mg/dL — ABNORMAL HIGH (ref 65–99)
Potassium: 3.3 mmol/L — ABNORMAL LOW (ref 3.5–5.1)
Sodium: 141 mmol/L (ref 135–145)

## 2016-08-02 LAB — ECHOCARDIOGRAM COMPLETE
Height: 68 in
WEIGHTICAEL: 3280 [oz_av]

## 2016-08-02 LAB — CBC
HCT: 41.6 % (ref 40.0–52.0)
HEMOGLOBIN: 13.9 g/dL (ref 13.0–18.0)
MCH: 30.4 pg (ref 26.0–34.0)
MCHC: 33.4 g/dL (ref 32.0–36.0)
MCV: 91.1 fL (ref 80.0–100.0)
PLATELETS: 162 10*3/uL (ref 150–440)
RBC: 4.56 MIL/uL (ref 4.40–5.90)
RDW: 15 % — AB (ref 11.5–14.5)
WBC: 6.2 10*3/uL (ref 3.8–10.6)

## 2016-08-02 LAB — TROPONIN I
TROPONIN I: 1.15 ng/mL — AB (ref <0.03)
TROPONIN I: 1.3 ng/mL — AB (ref <0.03)
Troponin I: 0.7 ng/mL (ref <0.03)

## 2016-08-02 LAB — APTT

## 2016-08-02 LAB — URIC ACID: URIC ACID, SERUM: 10.2 mg/dL — AB (ref 4.4–7.6)

## 2016-08-02 LAB — PROTIME-INR
INR: 1.11
Prothrombin Time: 14.4 seconds (ref 11.4–15.2)

## 2016-08-02 LAB — HEPARIN LEVEL (UNFRACTIONATED): HEPARIN UNFRACTIONATED: 0.82 [IU]/mL — AB (ref 0.30–0.70)

## 2016-08-02 MED ORDER — HEPARIN SODIUM (PORCINE) 5000 UNIT/ML IJ SOLN
5000.0000 [IU] | Freq: Three times a day (TID) | INTRAMUSCULAR | Status: DC
  Administered 2016-08-02: 5000 [IU] via SUBCUTANEOUS
  Filled 2016-08-02: qty 1

## 2016-08-02 MED ORDER — NIFEDIPINE ER OSMOTIC RELEASE 90 MG PO TB24
90.0000 mg | ORAL_TABLET | Freq: Every day | ORAL | Status: DC
  Administered 2016-08-02 – 2016-08-03 (×2): 90 mg via ORAL
  Filled 2016-08-02 (×2): qty 1

## 2016-08-02 MED ORDER — OXYCODONE HCL 5 MG PO TABS
5.0000 mg | ORAL_TABLET | ORAL | Status: DC | PRN
Start: 2016-08-02 — End: 2016-08-03

## 2016-08-02 MED ORDER — PANTOPRAZOLE SODIUM 40 MG PO TBEC
40.0000 mg | DELAYED_RELEASE_TABLET | Freq: Every day | ORAL | Status: DC
  Administered 2016-08-02 – 2016-08-03 (×2): 40 mg via ORAL
  Filled 2016-08-02 (×2): qty 1

## 2016-08-02 MED ORDER — ONDANSETRON HCL 4 MG/2ML IJ SOLN: 4.0000 mg | Freq: Four times a day (QID) | INTRAMUSCULAR | Status: DC | PRN

## 2016-08-02 MED ORDER — LABETALOL HCL 5 MG/ML IV SOLN
INTRAVENOUS | Status: AC
  Filled 2016-08-02: qty 4

## 2016-08-02 MED ORDER — HEPARIN (PORCINE) IN NACL 100-0.45 UNIT/ML-% IJ SOLN
950.0000 [IU]/h | INTRAMUSCULAR | Status: DC
  Administered 2016-08-02: 1150 [IU]/h via INTRAVENOUS
  Administered 2016-08-03: 950 [IU]/h via INTRAVENOUS
  Filled 2016-08-02 (×2): qty 250

## 2016-08-02 MED ORDER — HEPARIN BOLUS VIA INFUSION
4000.0000 [IU] | Freq: Once | INTRAVENOUS | Status: AC
  Administered 2016-08-02: 4000 [IU] via INTRAVENOUS
  Filled 2016-08-02: qty 4000

## 2016-08-02 MED ORDER — RAMIPRIL 10 MG PO CAPS
10.0000 mg | ORAL_CAPSULE | Freq: Every day | ORAL | Status: DC
  Administered 2016-08-02 – 2016-08-03 (×2): 10 mg via ORAL
  Filled 2016-08-02 (×2): qty 1

## 2016-08-02 MED ORDER — ACETAMINOPHEN 325 MG PO TABS
650.0000 mg | ORAL_TABLET | Freq: Four times a day (QID) | ORAL | Status: DC | PRN
  Administered 2016-08-02: 650 mg via ORAL
  Filled 2016-08-02: qty 2

## 2016-08-02 MED ORDER — SODIUM CHLORIDE 0.9% FLUSH
3.0000 mL | Freq: Two times a day (BID) | INTRAVENOUS | Status: DC
  Administered 2016-08-02 (×4): 3 mL via INTRAVENOUS

## 2016-08-02 MED ORDER — PNEUMOCOCCAL VAC POLYVALENT 25 MCG/0.5ML IJ INJ
0.5000 mL | INJECTION | INTRAMUSCULAR | Status: AC
  Administered 2016-08-03: 0.5 mL via INTRAMUSCULAR
  Filled 2016-08-02: qty 0.5

## 2016-08-02 MED ORDER — ONDANSETRON HCL 4 MG PO TABS: 4.0000 mg | ORAL_TABLET | Freq: Four times a day (QID) | ORAL | Status: DC | PRN

## 2016-08-02 MED ORDER — HYDRALAZINE HCL 20 MG/ML IJ SOLN
10.0000 mg | INTRAMUSCULAR | Status: DC | PRN
  Administered 2016-08-02: 10 mg via INTRAVENOUS
  Filled 2016-08-02 (×2): qty 1

## 2016-08-02 MED ORDER — LABETALOL HCL 5 MG/ML IV SOLN
10.0000 mg | INTRAVENOUS | Status: DC | PRN
  Administered 2016-08-02 (×3): 10 mg via INTRAVENOUS
  Filled 2016-08-02 (×2): qty 4

## 2016-08-02 MED ORDER — ASPIRIN EC 81 MG PO TBEC: 81.0000 mg | DELAYED_RELEASE_TABLET | Freq: Every day | ORAL | Status: DC

## 2016-08-02 MED ORDER — ASPIRIN EC 81 MG PO TBEC
81.0000 mg | DELAYED_RELEASE_TABLET | Freq: Every day | ORAL | Status: DC
  Administered 2016-08-02 – 2016-08-03 (×2): 81 mg via ORAL
  Filled 2016-08-02 (×2): qty 1

## 2016-08-02 MED ORDER — ACETAMINOPHEN 650 MG RE SUPP: 650.0000 mg | Freq: Four times a day (QID) | RECTAL | Status: DC | PRN

## 2016-08-02 MED ORDER — SODIUM CHLORIDE 0.9 % IV SOLN
INTRAVENOUS | Status: DC
  Administered 2016-08-02 – 2016-08-03 (×2): via INTRAVENOUS

## 2016-08-02 NOTE — Progress Notes (Signed)
*  PRELIMINARY RESULTS* Echocardiogram 2D Echocardiogram has been performed.  Adrian Blevins, Adrian Blevins 08/02/2016, 2:41 PM

## 2016-08-02 NOTE — Progress Notes (Signed)
ANTICOAGULATION CONSULT NOTE - Initial Consult  Pharmacy Consult for Heparin dosing Indication: chest pain/ACS  No Known Allergies  Patient Measurements: Height: 5\' 8"  (172.7 cm) Weight: 205 lb (93 kg) IBW/kg (Calculated) : 68.4 Heparin Dosing Weight: 87.7 kg  Vital Signs: Temp: 98.5 F (36.9 C) (02/28 1102) Temp Source: Oral (02/28 1102) BP: 190/101 (02/28 1102) Pulse Rate: 77 (02/28 1103)  Labs:  Recent Labs  08/01/16 2059 08/02/16 0216 08/02/16 0756  HGB 15.7  --  13.9  HCT 45.7  --  41.6  PLT 178  --  162  CREATININE 3.28*  --  2.96*  TROPONINI <0.03 0.70* 1.15*    Estimated Creatinine Clearance: 26.1 mL/min (by C-G formula based on SCr of 2.96 mg/dL (H)).   Medical History: Past Medical History:  Diagnosis Date  . GERD (gastroesophageal reflux disease)   . Hypertension     Medications:  Scheduled:  . aspirin EC  81 mg Oral Daily  . heparin  4,000 Units Intravenous Once  . NIFEdipine  90 mg Oral Daily  . pantoprazole  40 mg Oral Daily  . [START ON 08/03/2016] pneumococcal 23 valent vaccine  0.5 mL Intramuscular Tomorrow-1000  . ramipril  10 mg Oral Daily  . sodium chloride flush  3 mL Intravenous Q12H    Assessment: Patient admitted w/ CP and elevated trop 1.15 - 0.7 and hypertensive crisis Patient is being started on heparin for ACS  Goal of Therapy:  Heparin level 0.3-0.7 units/ml Monitor platelets by anticoagulation protocol: Yes   Plan:  Give 4000 units bolus x 1  Will start heparin drip @ 1150 unit/hour (13 unit/kg/hr based on HDW) and checking a HL 8 hours after start of infusion @ 2130. Getting stat protime/INR and aPTT.  Thank you for this consult.  Thomasene Rippleavid Senita Corredor, PharmD, BCPS Clinical Pharmacist 08/02/2016

## 2016-08-02 NOTE — Progress Notes (Signed)
[facility] [place] [facility]   PATIENT NAME: Adrian Blevins    MR#:  [id number]  DATE OF BIRTH:  20-Dec-1945  SUBJECTIVE:   Patient here with chest pain and noted to have elevated troponin and has ruled in for a non-ST elevation MI. Clinically asymptomatic presently. Also noted to be in acute renal failure but unclear what his baseline creatinine.  REVIEW OF SYSTEMS:    Review of Systems  Constitutional: Negative for chills and fever.  HENT: Negative for congestion and tinnitus.   Eyes: Negative for blurred vision and double vision.  Respiratory: Negative for cough, shortness of breath and wheezing.   Cardiovascular: Negative for chest pain, orthopnea and PND.  Gastrointestinal: Negative for abdominal pain, diarrhea, nausea and vomiting.  Genitourinary: Negative for dysuria and hematuria.  Neurological: Negative for dizziness, sensory change and focal weakness.  All other systems reviewed and are negative.   Nutrition: Heart Healthy Tolerating Diet: Yes Tolerating PT: Ambulatory   DRUG ALLERGIES:  No Known Allergies  VITALS:  Blood pressure (!) 185/94, pulse 76, temperature 98.5 F (36.9 C), temperature source Oral, resp. rate 19, height 5\' 8"  (1.727 m), weight 93 kg (205 lb), SpO2 95 %.  PHYSICAL EXAMINATION:   Physical Exam  GENERAL:  71 y.o.-year-old patient sitting up in bed in NAD.  EYES: Pupils equal, round, reactive to light and accommodation. No scleral icterus. Extraocular muscles intact.  HEENT: Head atraumatic, normocephalic. Oropharynx and nasopharynx clear.  NECK:  Supple, no jugular venous distention. No thyroid enlargement, no tenderness.  LUNGS: Normal breath sounds bilaterally, no wheezing, rales, rhonchi. No use of accessory muscles of respiration.  CARDIOVASCULAR: S1, S2 normal. No murmurs, rubs, or gallops.  ABDOMEN: Soft, nontender, nondistended. Bowel sounds present. No organomegaly or mass.  EXTREMITIES: No cyanosis, clubbing  or edema b/l.    NEUROLOGIC: Cranial nerves II through XII are intact. No focal Motor or sensory deficits b/l.   PSYCHIATRIC: The patient is alert and oriented x 3.  SKIN: No obvious rash, lesion, or ulcer.    LABORATORY PANEL:   CBC  Recent Labs Lab 08/02/16 0756  WBC 6.2  HGB 13.9  HCT 41.6  PLT 162   ------------------------------------------------------------------------------------------------------------------  Chemistries   Recent Labs Lab 08/02/16 0756  NA 141  K 3.3*  CL 102  CO2 28  GLUCOSE 105*  BUN 31*  CREATININE 2.96*  CALCIUM 8.7*   ------------------------------------------------------------------------------------------------------------------  Cardiac Enzymes  Recent Labs Lab 08/02/16 1353  TROPONINI 1.30*   ------------------------------------------------------------------------------------------------------------------  RADIOLOGY:  Dg Chest 2 View  Result Date: 08/01/2016 CLINICAL DATA:  Midsternal chest pain. EXAM: CHEST  2 VIEW COMPARISON:  01/27/2011 FINDINGS: Unchanged heart size and mediastinal contours with thoracic aortic tortuosity. No pulmonary edema. No consolidation, pleural fluid or pneumothorax. Probable scarring anteriorly in the lingula. Minimal bronchial thickening. No acute osseous abnormalities are seen. IMPRESSION: Bronchial thickening, may be infectious or inflammatory. Electronically Signed   By: Rubye Oaks M.D.   On: 08/01/2016 21:37   US Renal  Result Date: 08/02/2016 CLINICAL DATA:  Status post right nephrectomy 2002 EXAM: RENAL / URINARY TRACT ULTRASOUND COMPLETE COMPARISON:  12/17/2009 CT scan abdomen and pelvis FINDINGS: Right Kidney: Surgically absent Left Kidney: Length: 12.4 cm. There is normal echogenicity. A medial cyst in upper pole measures 5.3 x 3.9 x 3.4 cm on the prior CT scan measures 2 3.2 cm. There is a cyst in midpole measures 4.2 by 4 cm. On the prior CT scan measured 2.4 cm. No renal  calculi. No  hydronephrosis. Bladder: Appears normal for degree of bladder distention. Left ureteral jet is visualized. IMPRESSION: 1. Status post right nephrectomy. 2. No left hydronephrosis or renal calculi. There is a cyst in upper pole measures 5.3 by 3.9 cm on the prior exam measures 3.2 cm. Cyst in midpole measures 4.2 x 4 cm on the prior exam measures 2.4 cm. 3. Unremarkable urinary bladder.  Left ureteral jet is visualized Electronically Signed   By: Natasha MeadLiviu  Pop M.D.   On: 08/02/2016 11:57     ASSESSMENT AND PLAN:   71 year old male with past medical history of renal cell carcinoma status post right-sided nephrectomy, hypertension, GERD who presented to the hospital due to chest pain.  1. Non-ST elevation MI-patient has ruled in by cardiac markers as his troponin have peaked as high as 1. -He is currently chest pain-free and hemodynamically stable. -Continue aspirin, heparin drip, beta blocker, Ace  - seen by Cards and plan for Myoview in a.m.  No CAth for now given CKD Stage IV.   2. Acute on chronic renal failure - baseline Cr. As per Renal around 1.8 and now up to 2.9.  - will given gentle IV fluids and follow BUN/Cr.  Appreciate Nephro input. Renal US (-) for hydro.   3. Accelerated HTN - cont. Ramipril, Nifedipine  - cont. PRN Labetalol, Hydralazine and follow BP  4. GERD-continue Protonix.    All the records are reviewed and case discussed with Care Management/Social Worker. Management plans discussed with the patient, family and they are in agreement.  CODE STATUS: Full  DVT Prophylaxis: Heparin gtt  TOTAL TIME TAKING CARE OF THIS PATIENT: 30 minutes.   POSSIBLE D/C IN 1-2 DAYS, DEPENDING ON CLINICAL CONDITION.   [place] SirenSAINANI,VIVEK J M.D on 08/02/2016 at 3:30 PM  Between 7am to 6pm - Pager - [phone]  After 6pm go to [email or web address] - Social research officer, governmentpassword [email or web address] [email or web address]  [facility]  Office  [phone]  CC: Primary care physician; Kelton PillarSyed A Masood, MD

## 2016-08-02 NOTE — Progress Notes (Addendum)
ANTICOAGULATION CONSULT NOTE - Initial Consult  Pharmacy Consult for Heparin dosing Indication: chest pain/ACS  No Known Allergies  Patient Measurements: Height: 5\' 8"  (172.7 cm) Weight: 205 lb (93 kg) IBW/kg (Calculated) : 68.4 Heparin Dosing Weight: 87.7 kg  Vital Signs: Temp: 98.5 F (36.9 C) (02/28 1937) Temp Source: Oral (02/28 1937) BP: 165/86 (02/28 1937) Pulse Rate: 92 (02/28 1937)  Labs:  Recent Labs  08/01/16 2059 08/02/16 0216 08/02/16 0756 08/02/16 1353 08/02/16 2133  HGB 15.7  --  13.9  --   --   HCT 45.7  --  41.6  --   --   PLT 178  --  162  --   --   APTT  --   --   --  >160*  --   LABPROT  --   --   --  14.4  --   INR  --   --   --  1.11  --   HEPARINUNFRC  --   --   --   --  0.82*  CREATININE 3.28*  --  2.96*  --   --   TROPONINI <0.03 0.70* 1.15* 1.30*  --     Estimated Creatinine Clearance: 26.1 mL/min (by C-G formula based on SCr of 2.96 mg/dL (H)).   Medical History: Past Medical History:  Diagnosis Date  . GERD (gastroesophageal reflux disease)   . Hypertension     Medications:  Scheduled:  . aspirin EC  81 mg Oral Daily  . NIFEdipine  90 mg Oral Daily  . pantoprazole  40 mg Oral Daily  . [START ON 08/03/2016] pneumococcal 23 valent vaccine  0.5 mL Intramuscular Tomorrow-1000  . ramipril  10 mg Oral Daily  . sodium chloride flush  3 mL Intravenous Q12H    Assessment: Patient admitted w/ CP and elevated trop 1.15 - 0.7 and hypertensive crisis Patient is being started on heparin for ACS  Goal of Therapy:  Heparin level 0.3-0.7 units/ml Monitor platelets by anticoagulation protocol: Yes   Plan:  Give 4000 units bolus x 1  Will start heparin drip @ 1150 unit/hour (13 unit/kg/hr based on HDW) and checking a HL 8 hours after start of infusion @ 2130. Getting stat protime/INR and aPTT.  2/28: HL @ 21:30 = 0.82 Will decrease drip rate to 950 units/hr and recheck HL 8 hrs after rate change.   3/1 0513 HL therapeutic x 1. Continue  current rate. Will recheck HL in 6 hours. NAC  Thank you for this consult.  Robbins,Jason D Clinical Pharmacist 08/02/2016

## 2016-08-02 NOTE — Plan of Care (Signed)
Problem: Cardiac: Goal: Ability to achieve and maintain adequate cardiovascular perfusion will improve Outcome: Not Progressing Blood pressures remain elevated.  Trending down after prn meds given.  Troponins elevated this shift.  No complaints of pain since arrival to unit.

## 2016-08-02 NOTE — Progress Notes (Signed)
Lab telephoned this nurse make aware of PTT >160. Pharmacist made aware states not true baseline due to heparin drip started at 1313 and lab draw at 1353

## 2016-08-02 NOTE — Progress Notes (Signed)
Notified of elevated troponin level to 0.70.  Patient without pain at the time.  Dr Sheryle Hailiamond made aware and orders entered.

## 2016-08-02 NOTE — Care Management Obs Status (Signed)
MEDICARE OBSERVATION STATUS NOTIFICATION   Patient Details  Name: Adrian DroneWilliam G Bisping MRN: [id number] Date of Birth: 10-30-1945   Medicare Observation Status Notification Given:  Yes    Eber HongGreene, Calvert Charland R, RN 08/02/2016, 11:30 AM

## 2016-08-02 NOTE — Consult Note (Signed)
Reason for Consult: Chest pain possible angina elevated blood pressure Referring Physician: Dr. Lance Coon hospitalist, primary physician Dr. Cato Mulligan Adrian Blevins is an 71 y.o. male.  HPI: Patient's a 71 year old male history of chronic renal insufficiency status post nephrectomy for nonmalignant mass patient states to [place] well started having chest pain Has Severely Elevated Blood Pressure Was Brought to Emergency Room EKG Was Nondiagnostic but He Had Elevated Troponins but Significant Renal Insufficiency. Patient was given a GI cocktail emergency room which seemed to resolve his symptoms patient now here for further assessment and evaluation.  Past Medical History:  Diagnosis Date  . GERD (gastroesophageal reflux disease)   . Hypertension     Past Surgical History:  Procedure Laterality Date  . NEPHRECTOMY Left     Family History  Problem Relation Age of Onset  . Hypertension Other     Social History:  reports that he has never smoked. He has never used smokeless tobacco. He reports that he does not drink alcohol or use drugs.  Allergies: No Known Allergies  Medications: I have reviewed the patient's current medications.  Results for orders placed or performed during the hospital encounter of 08/01/16 (from the past 48 hour(s))  Basic metabolic panel     Status: Abnormal   Collection Time: 08/01/16  8:59 PM  Result Value Ref Range   Sodium 139 135 - 145 mmol/L   Potassium 3.2 (L) 3.5 - 5.1 mmol/L   Chloride 101 101 - 111 mmol/L   CO2 28 22 - 32 mmol/L   Glucose, Bld 118 (H) 65 - 99 mg/dL   BUN 33 (H) 6 - 20 mg/dL   Creatinine, Ser 3.28 (H) 0.61 - 1.24 mg/dL   Calcium 9.5 8.9 - 10.3 mg/dL   GFR calc non Af Amer 18 (L) >60 mL/min   GFR calc Af Amer 21 (L) >60 mL/min    Comment: (NOTE) The eGFR has been calculated using the CKD EPI equation. This calculation has not been validated in all clinical situations. eGFR's persistently <60 mL/min signify possible Chronic  Kidney Disease.    Anion gap 10 5 - 15  CBC     Status: Abnormal   Collection Time: 08/01/16  8:59 PM  Result Value Ref Range   WBC 8.8 3.8 - 10.6 K/uL   RBC 5.03 4.40 - 5.90 MIL/uL   Hemoglobin 15.7 13.0 - 18.0 g/dL   HCT 45.7 40.0 - 52.0 %   MCV 90.8 80.0 - 100.0 fL   MCH 31.1 26.0 - 34.0 pg   MCHC 34.3 32.0 - 36.0 g/dL   RDW 15.2 (H) 11.5 - 14.5 %   Platelets 178 150 - 440 K/uL  Troponin I     Status: None   Collection Time: 08/01/16  8:59 PM  Result Value Ref Range   Troponin I <0.03 <0.03 ng/mL  Troponin I     Status: Abnormal   Collection Time: 08/02/16  2:16 AM  Result Value Ref Range   Troponin I 0.70 (HH) <0.03 ng/mL    Comment: CRITICAL RESULT CALLED TO, READ BACK BY AND VERIFIED WITH ALISA SCOTT @ 0439 ON 08/02/2016 BY CAF   Troponin I     Status: Abnormal   Collection Time: 08/02/16  7:56 AM  Result Value Ref Range   Troponin I 1.15 (HH) <0.03 ng/mL    Comment: CRITICAL VALUE NOTED. VALUE IS CONSISTENT WITH PREVIOUSLY REPORTED/CALLED VALUE. SGD  Basic metabolic panel     Status: Abnormal  Collection Time: 08/02/16  7:56 AM  Result Value Ref Range   Sodium 141 135 - 145 mmol/L   Potassium 3.3 (L) 3.5 - 5.1 mmol/L   Chloride 102 101 - 111 mmol/L   CO2 28 22 - 32 mmol/L   Glucose, Bld 105 (H) 65 - 99 mg/dL   BUN 31 (H) 6 - 20 mg/dL   Creatinine, Ser 2.96 (H) 0.61 - 1.24 mg/dL   Calcium 8.7 (L) 8.9 - 10.3 mg/dL   GFR calc non Af Amer 20 (L) >60 mL/min   GFR calc Af Amer 23 (L) >60 mL/min    Comment: (NOTE) The eGFR has been calculated using the CKD EPI equation. This calculation has not been validated in all clinical situations. eGFR's persistently <60 mL/min signify possible Chronic Kidney Disease.    Anion gap 11 5 - 15  CBC     Status: Abnormal   Collection Time: 08/02/16  7:56 AM  Result Value Ref Range   WBC 6.2 3.8 - 10.6 K/uL   RBC 4.56 4.40 - 5.90 MIL/uL   Hemoglobin 13.9 13.0 - 18.0 g/dL   HCT 41.6 40.0 - 52.0 %   MCV 91.1 80.0 - 100.0 fL    MCH 30.4 26.0 - 34.0 pg   MCHC 33.4 32.0 - 36.0 g/dL   RDW 15.0 (H) 11.5 - 14.5 %   Platelets 162 150 - 440 K/uL    Dg Chest 2 View  Result Date: 08/01/2016 CLINICAL DATA:  Midsternal chest pain. EXAM: CHEST  2 VIEW COMPARISON:  01/27/2011 FINDINGS: Unchanged heart size and mediastinal contours with thoracic aortic tortuosity. No pulmonary edema. No consolidation, pleural fluid or pneumothorax. Probable scarring anteriorly in the lingula. Minimal bronchial thickening. No acute osseous abnormalities are seen. IMPRESSION: Bronchial thickening, may be infectious or inflammatory. Electronically Signed   By: Jeb Levering M.D.   On: 08/01/2016 21:37   US Renal  Result Date: 08/02/2016 CLINICAL DATA:  Status post right nephrectomy 2002 EXAM: RENAL / URINARY TRACT ULTRASOUND COMPLETE COMPARISON:  12/17/2009 CT scan abdomen and pelvis FINDINGS: Right Kidney: Surgically absent Left Kidney: Length: 12.4 cm. There is normal echogenicity. A medial cyst in upper pole measures 5.3 x 3.9 x 3.4 cm on the prior CT scan measures 2 3.2 cm. There is a cyst in midpole measures 4.2 by 4 cm. On the prior CT scan measured 2.4 cm. No renal calculi. No hydronephrosis. Bladder: Appears normal for degree of bladder distention. Left ureteral jet is visualized. IMPRESSION: 1. Status post right nephrectomy. 2. No left hydronephrosis or renal calculi. There is a cyst in upper pole measures 5.3 by 3.9 cm on the prior exam measures 3.2 cm. Cyst in midpole measures 4.2 x 4 cm on the prior exam measures 2.4 cm. 3. Unremarkable urinary bladder.  Left ureteral jet is visualized Electronically Signed   By: Lahoma Crocker M.D.   On: 08/02/2016 11:57    Review of Systems  Constitutional: Positive for diaphoresis.  HENT: Negative.   Eyes: Negative.   Respiratory: Negative.   Cardiovascular: Positive for chest pain.  Gastrointestinal: Positive for heartburn.  Genitourinary: Negative.   Musculoskeletal: Negative.   Skin: Negative.    Neurological: Positive for headaches.  Endo/Heme/Allergies: Negative.   Psychiatric/Behavioral: Negative.    Blood pressure (!) 190/101, pulse 77, temperature 98.5 F (36.9 C), temperature source Oral, resp. rate 19, height 5' 8" (1.727 m), weight 93 kg (205 lb), SpO2 95 %. Physical Exam  Nursing note and vitals reviewed. Constitutional: He  is oriented to person, place, and time. He appears well-developed and well-nourished.  HENT:  Head: Normocephalic and atraumatic.  Eyes: Conjunctivae and EOM are normal. Pupils are equal, round, and reactive to light.  Neck: Normal range of motion. Neck supple.  Cardiovascular: Normal rate, regular rhythm and normal heart sounds.   Respiratory: Effort normal and breath sounds normal.  GI: Soft. Bowel sounds are normal.  Musculoskeletal: Normal range of motion.  Neurological: He is alert and oriented to person, place, and time. He has normal reflexes.  Skin: Skin is warm and dry.  Psychiatric: He has a normal mood and affect.    Assessment/Plan: Chest pain  Possible angina Obesity Hypertension GERD Chronic renal insufficiency stage IV History of nephrectomy Elevated troponin Hypokalemia . Plan Agree with admitted left myocardial infarction Follow-up EKGs troponins Corrected electrolytes Echocardiogram for assessment of left ventricular function Consider functional study prior to discharge Continue reflux therapy with Nexium Zantac consider Carafate Have the patient follow-up with primary physician Dr. Waymon Budge D [place] 08/02/2016, 1:23 PM

## 2016-08-02 NOTE — Consult Note (Signed)
[facility]  CONSULT NOTE    Date: 08/02/2016                  Patient Name:  Adrian Blevins  MRN: [id number]  DOB: Sep 15, 1945  Age / Sex: 71 y.o., male         PCP: Kelton Pillar, MD                 Service Requesting Consult: Dr. Cherlynn Kaiser                 Reason for Consult: Acute renal failure versus chronic kidney disease            History of Present Illness: Adrian Blevins is a 71 y.o. black male with history of left nephrectomy 2002 Dr. Evelene Croon, hypertension and GERD, who was admitted to [facility] on 08/01/2016 for Atypical chest pain [R07.89] Hypertensive urgency [I16.0]   Wife is at bedside and assists with history taking. Patient states he has been having difficulty with swallowing food and burning on his chest. He states that this has been an ongoing problem.   He states that he has been diagnosed with hypertension for several years. Home regimen of ramipril and nifedipine. He does not check his blood pressure at home.   He denies use of nonsteroidal anti-inflammatory agents.    Medications: Outpatient medications: Prescriptions Prior to Admission  Medication Sig Dispense Refill Last Dose  . aspirin EC 81 MG tablet Take 81 mg by mouth daily.   08/01/2016 at Unknown time  . Multiple Vitamin (MULTIVITAMIN WITH MINERALS) TABS tablet Take 1 tablet by mouth daily.   08/01/2016 at Unknown time  . NIFEdipine (PROCARDIA XL/ADALAT-CC) 90 MG 24 hr tablet Take 90 mg by mouth daily.     . ramipril (ALTACE) 10 MG capsule Take 10 mg by mouth daily.       Current medications: Current Facility-Administered Medications  Medication Dose Route Frequency Provider Last Rate Last Dose  . acetaminophen (TYLENOL) tablet 650 mg  650 mg Oral Q6H PRN Oralia Manis, MD       Or  . acetaminophen (TYLENOL) suppository 650 mg  650 mg Rectal Q6H PRN Oralia Manis, MD      . aspirin EC tablet 81 mg  81 mg Oral Daily Arnaldo Natal, MD   81 mg at 08/02/16 4098  . heparin  injection 5,000 Units  5,000 Units Subcutaneous Q8H Oralia Manis, MD   5,000 Units at 08/02/16 [phone]  . labetalol (NORMODYNE,TRANDATE) injection 10 mg  10 mg Intravenous Q2H PRN Oralia Manis, MD   10 mg at 08/02/16 0945  . ondansetron (ZOFRAN) tablet 4 mg  4 mg Oral Q6H PRN Oralia Manis, MD       Or  . ondansetron [facility]) injection 4 mg  4 mg Intravenous Q6H PRN Oralia Manis, MD      . oxyCODONE (Oxy IR/ROXICODONE) immediate release tablet 5 mg  5 mg Oral Q4H PRN Oralia Manis, MD      . Melene Muller ON 08/03/2016] pneumococcal 23 valent vaccine (PNU-IMMUNE) injection 0.5 mL  0.5 mL Intramuscular Tomorrow-1000 Oralia Manis, MD      . sodium chloride flush (NS) 0.9 % injection 3 mL  3 mL Intravenous Q12H Oralia Manis, MD   3 mL at 08/02/16 0945      Allergies: No Known Allergies    Past Medical History: Past Medical History:  Diagnosis Date  . GERD (gastroesophageal reflux disease)   .  Hypertension      Past Surgical History: Past Surgical History:  Procedure Laterality Date  . NEPHRECTOMY Left      Family History: Family History  Problem Relation Age of Onset  . Hypertension Other      Social History: Social History   Social History  . Marital status: Married    Spouse name: N/A  . Number of children: N/A  . Years of education: N/A   Occupational History  . Not on file.   Social History Main Topics  . Smoking status: Never Smoker  . Smokeless tobacco: Never Used  . Alcohol use No  . Drug use: No  . Sexual activity: Not on file   Other Topics Concern  . Not on file   Social History Narrative  . No narrative on file     Review of Systems: Review of Systems  Constitutional: Negative.  Negative for chills, diaphoresis, fever, malaise/fatigue and weight loss.  HENT: Negative.  Negative for congestion, ear discharge, ear pain, hearing loss, nosebleeds, sinus pain, sore throat and tinnitus.   Eyes: Negative.  Negative for blurred vision, double vision, photophobia,  pain, discharge and redness.  Respiratory: Negative.  Negative for cough, hemoptysis, sputum production, shortness of breath, wheezing and stridor.   Cardiovascular: Positive for chest pain. Negative for palpitations, orthopnea, claudication, leg swelling and PND.  Gastrointestinal: Positive for heartburn. Negative for abdominal pain, blood in stool, constipation, diarrhea, melena, nausea and vomiting.  Genitourinary: Negative.  Negative for dysuria, flank pain, frequency, hematuria and urgency.  Musculoskeletal: Negative.  Negative for back pain, falls, joint pain, myalgias and neck pain.  Skin: Negative.  Negative for itching and rash.  Neurological: Negative.  Negative for dizziness, tingling, tremors, sensory change, speech change, focal weakness, seizures, loss of consciousness, weakness and headaches.  Endo/Heme/Allergies: Negative.  Negative for environmental allergies and polydipsia. Does not bruise/bleed easily.  Psychiatric/Behavioral: Negative.  Negative for depression, hallucinations, memory loss, substance abuse and suicidal ideas. The patient is not nervous/anxious and does not have insomnia.     Vital Signs: Blood pressure (!) 184/93, pulse 71, temperature 98.2 F (36.8 C), temperature source Oral, resp. rate 19, height 5\' 8"  (1.727 m), weight 93 kg (205 lb), SpO2 96 %.  Weight trends: Filed Weights   08/01/16 2051  Weight: 93 kg (205 lb)    Physical Exam: General: NAD, laying in bed  Head: Normocephalic, atraumatic. Moist oral mucosal membranes  Eyes: Anicteric, PERRL  Neck: Supple, trachea midline  Lungs:  Clear to auscultation  Heart: Regular rate and rhythm  Abdomen:  Soft, nontender,   Extremities:  no peripheral edema.  Neurologic: Nonfocal, moving all four extremities  Skin: No lesions        Lab results: Basic Metabolic Panel:  Recent Labs Lab 08/01/16 2059 08/02/16 0756  NA 139 141  K 3.2* 3.3*  CL 101 102  CO2 28 28  GLUCOSE 118* 105*  BUN 33*  31*  CREATININE 3.28* 2.96*  CALCIUM 9.5 8.7*    Liver Function Tests: No results for input(s): AST, ALT, ALKPHOS, BILITOT, PROT, ALBUMIN in the last 168 hours. No results for input(s): LIPASE, AMYLASE in the last 168 hours. No results for input(s): AMMONIA in the last 168 hours.  CBC:  Recent Labs Lab 08/01/16 2059 08/02/16 0756  WBC 8.8 6.2  HGB 15.7 13.9  HCT 45.7 41.6  MCV 90.8 91.1  PLT 178 162    Cardiac Enzymes:  Recent Labs Lab 08/01/16 2059 08/02/16 0216 08/02/16  0756  TROPONINI <0.03 0.70* 1.15*    BNP: Invalid input(s): POCBNP  CBG: No results for input(s): GLUCAP in the last 168 hours.  Microbiology: No results found for this or any previous visit.  Coagulation Studies: No results for input(s): LABPROT, INR in the last 72 hours.  Urinalysis: No results for input(s): COLORURINE, LABSPEC, PHURINE, GLUCOSEU, HGBUR, BILIRUBINUR, KETONESUR, PROTEINUR, UROBILINOGEN, NITRITE, LEUKOCYTESUR in the last 72 hours.  Invalid input(s): APPERANCEUR    Imaging: Dg Chest 2 View  Result Date: 08/01/2016 CLINICAL DATA:  Midsternal chest pain. EXAM: CHEST  2 VIEW COMPARISON:  01/27/2011 FINDINGS: Unchanged heart size and mediastinal contours with thoracic aortic tortuosity. No pulmonary edema. No consolidation, pleural fluid or pneumothorax. Probable scarring anteriorly in the lingula. Minimal bronchial thickening. No acute osseous abnormalities are seen. IMPRESSION: Bronchial thickening, may be infectious or inflammatory. Electronically Signed   By: Rubye OaksMelanie  Ehinger M.D.   On: 08/01/2016 21:37      Assessment & Plan: Adrian Blevins is a 71 y.o. black male with history of left nephrectomy 2002 Dr. Evelene CroonWolff, hypertension and GERD, who [facility] for Atypical chest pain [R07.89] Hypertensive urgency [I16.0]   1. Acute renal failure versus chronic kidney disease stage IV: no baseline creatinine available.  With solitary kidney.  Discussed  chronic kidney disease and acute renal failure with patient.  Reviewed labs - Check urinalysis, SPEP/UPEP, PTH, uric acid and renal ultrasound.   2. Hypertension: with urgency. Home regimen of ramipril and nifedipine.  - IV hydralazine PRN - change vitals to q4.   3. Hypokalemia: potassium 3.3 - oral replacement when able to take PO.   4. Chest pain: concern for esophageal stricture.   LOS: 0 Aitan Rossbach 2/28/201810:57 AM

## 2016-08-03 DIAGNOSIS — Z23 Encounter for immunization: Secondary | ICD-10-CM | POA: Diagnosis not present

## 2016-08-03 LAB — PROTEIN ELECTROPHORESIS, SERUM
A/G Ratio: 1.1 (ref 0.7–1.7)
ALBUMIN ELP: 3.4 g/dL (ref 2.9–4.4)
ALPHA-1-GLOBULIN: 0.2 g/dL (ref 0.0–0.4)
ALPHA-2-GLOBULIN: 0.7 g/dL (ref 0.4–1.0)
Beta Globulin: 1.1 g/dL (ref 0.7–1.3)
GLOBULIN, TOTAL: 3.2 g/dL (ref 2.2–3.9)
Gamma Globulin: 1.1 g/dL (ref 0.4–1.8)
Total Protein ELP: 6.6 g/dL (ref 6.0–8.5)

## 2016-08-03 LAB — NM MYOCAR MULTI W/SPECT W/WALL MOTION / EF
CHL CUP NUCLEAR SDS: 4
CHL CUP NUCLEAR SRS: 9
CHL CUP NUCLEAR SSS: 9
CHL CUP RESTING HR STRESS: 85 {beats}/min
Estimated workload: 1 METS
Exercise duration (min): 1 min
Exercise duration (sec): 18 s
LVDIAVOL: 95 mL (ref 62–150)
LVSYSVOL: 40 mL
NUC STRESS TID: 1.09
Peak HR: 114 {beats}/min

## 2016-08-03 LAB — PARATHYROID HORMONE, INTACT (NO CA): PTH: 142 pg/mL — AB (ref 15–65)

## 2016-08-03 LAB — PROTEIN ELECTRO, RANDOM URINE
ALBUMIN ELP UR: 70.2 %
ALPHA-1-GLOBULIN, U: 4.8 %
ALPHA-2-GLOBULIN, U: 5.4 %
BETA GLOBULIN, U: 11.1 %
GAMMA GLOBULIN, U: 8.5 %
Total Protein, Urine: 271.4 mg/dL

## 2016-08-03 LAB — RENAL FUNCTION PANEL
ALBUMIN: 3.8 g/dL (ref 3.5–5.0)
ANION GAP: 10 (ref 5–15)
BUN: 24 mg/dL — ABNORMAL HIGH (ref 6–20)
CHLORIDE: 102 mmol/L (ref 101–111)
CO2: 28 mmol/L (ref 22–32)
Calcium: 8.6 mg/dL — ABNORMAL LOW (ref 8.9–10.3)
Creatinine, Ser: 2.41 mg/dL — ABNORMAL HIGH (ref 0.61–1.24)
GFR calc Af Amer: 30 mL/min — ABNORMAL LOW (ref >60)
GFR, EST NON AFRICAN AMERICAN: 26 mL/min — AB (ref >60)
GLUCOSE: 108 mg/dL — AB (ref 65–99)
PHOSPHORUS: 3.5 mg/dL (ref 2.5–4.6)
POTASSIUM: 3.1 mmol/L — AB (ref 3.5–5.1)
Sodium: 140 mmol/L (ref 135–145)

## 2016-08-03 LAB — HEPARIN LEVEL (UNFRACTIONATED)
HEPARIN UNFRACTIONATED: 0.68 [IU]/mL (ref 0.30–0.70)
Heparin Unfractionated: 0.29 IU/mL — ABNORMAL LOW (ref 0.30–0.70)

## 2016-08-03 LAB — KAPPA/LAMBDA LIGHT CHAINS
Kappa free light chain: 41 mg/L — ABNORMAL HIGH (ref 3.3–19.4)
Kappa, lambda light chain ratio: 1.67 — ABNORMAL HIGH (ref 0.26–1.65)
Lambda free light chains: 24.5 mg/L (ref 5.7–26.3)

## 2016-08-03 MED ORDER — TECHNETIUM TC 99M TETROFOSMIN IV KIT
30.0000 | PACK | Freq: Once | INTRAVENOUS | Status: AC | PRN
  Administered 2016-08-03: 30.36 via INTRAVENOUS

## 2016-08-03 MED ORDER — REGADENOSON 0.4 MG/5ML IV SOLN
0.4000 mg | Freq: Once | INTRAVENOUS | Status: AC
  Administered 2016-08-03: 0.4 mg via INTRAVENOUS
  Filled 2016-08-03: qty 5

## 2016-08-03 MED ORDER — CARVEDILOL 3.125 MG PO TABS: 3.1250 mg | ORAL_TABLET | Freq: Two times a day (BID) | ORAL | 1 refills | Status: AC

## 2016-08-03 MED ORDER — POTASSIUM CHLORIDE CRYS ER 20 MEQ PO TBCR
40.0000 meq | EXTENDED_RELEASE_TABLET | Freq: Once | ORAL | Status: AC
  Administered 2016-08-03: 40 meq via ORAL
  Filled 2016-08-03: qty 2

## 2016-08-03 MED ORDER — ISOSORBIDE MONONITRATE ER 30 MG PO TB24: 30.0000 mg | ORAL_TABLET | Freq: Every day | ORAL | 1 refills | Status: AC

## 2016-08-03 MED ORDER — TECHNETIUM TC 99M TETROFOSMIN IV KIT
13.0000 | PACK | Freq: Once | INTRAVENOUS | Status: AC | PRN
  Administered 2016-08-03: 13.05 via INTRAVENOUS

## 2016-08-03 NOTE — Progress Notes (Signed)
Discharged to home with his wife.  Insttructed to make follow up appointments with Dr. Wynelle LinkKolluru, and Dr. Juliann Paresallwood within the next 1-2 weeks.  Also to schedule a visit with his PCP.  Reviewed with him  and gave him printed information about the new medications.

## 2016-08-03 NOTE — Care Management Important Message (Signed)
Important Message  Patient Details  Name: Adrian Blevins MRN: [id number] Date of Birth: August 10, 1945   Medicare Important Message Given:  Yes  Initial signed IM printed from Epic and given to patient.     Eber HongGreene, Earle Troiano R, RN 08/03/2016, 2:47 PM

## 2016-08-03 NOTE — Progress Notes (Signed)
[facility]  ROUNDING NOTE   Subjective:   No more chest pain.   Creatinine 2.41 (2.96) K 3.1   NS at 4975mL/hr  Objective:  Vital signs in last 24 hours:  Temp:  [98.3 F (36.8 C)-98.5 F (36.9 C)] 98.4 F (36.9 C) (03/01 0800) Pulse Rate:  [70-96] 83 (03/01 0800) Resp:  [18] 18 (03/01 0800) BP: (165-190)/(86-101) 169/93 (03/01 0800) SpO2:  [93 %-96 %] 93 % (03/01 0800)  Weight change:  Filed Weights   08/01/16 2051  Weight: 93 kg (205 lb)    Intake/Output: I/O last 3 completed shifts: In: 430 [I.V.:430] Out: 200 [Urine:200]   Intake/Output this shift:  Total I/O In: -  Out: 600 [Urine:600]  Physical Exam: General: NAD,   Head: Normocephalic, atraumatic. Moist oral mucosal membranes  Eyes: Anicteric, PERRL  Neck: Supple, trachea midline  Lungs:  Clear to auscultation  Heart: Regular rate and rhythm  Abdomen:  Soft, nontender,   Extremities: no peripheral edema.  Neurologic: Nonfocal, moving all four extremities  Skin: No lesions       Basic Metabolic Panel:  Recent Labs Lab 08/01/16 2059 08/02/16 0756 08/03/16 0513  NA 139 141 140  K 3.2* 3.3* 3.1*  CL 101 102 102  CO2 28 28 28   GLUCOSE 118* 105* 108*  BUN 33* 31* 24*  CREATININE 3.28* 2.96* 2.41*  CALCIUM 9.5 8.7* 8.6*  PHOS  --   --  3.5    Liver Function Tests:  Recent Labs Lab 08/03/16 0513  ALBUMIN 3.8   No results for input(s): LIPASE, AMYLASE in the last 168 hours. No results for input(s): AMMONIA in the last 168 hours.  CBC:  Recent Labs Lab 08/01/16 2059 08/02/16 0756  WBC 8.8 6.2  HGB 15.7 13.9  HCT 45.7 41.6  MCV 90.8 91.1  PLT 178 162    Cardiac Enzymes:  Recent Labs Lab 08/01/16 2059 08/02/16 0216 08/02/16 0756 08/02/16 1353  TROPONINI <0.03 0.70* 1.15* 1.30*    BNP: Invalid input(s): POCBNP  CBG: No results for input(s): GLUCAP in the last 168 hours.  Microbiology: No results found for this or any previous visit.  Coagulation  Studies:  Recent Labs  08/02/16 1353  LABPROT 14.4  INR 1.11    Urinalysis:  Recent Labs  08/02/16 1608  COLORURINE YELLOW*  LABSPEC 1.011  PHURINE 6.0  GLUCOSEU 50*  HGBUR NEGATIVE  BILIRUBINUR NEGATIVE  KETONESUR NEGATIVE  PROTEINUR >=300*  NITRITE NEGATIVE  LEUKOCYTESUR NEGATIVE      Imaging: Dg Chest 2 View  Result Date: 08/01/2016 CLINICAL DATA:  Midsternal chest pain. EXAM: CHEST  2 VIEW COMPARISON:  01/27/2011 FINDINGS: Unchanged heart size and mediastinal contours with thoracic aortic tortuosity. No pulmonary edema. No consolidation, pleural fluid or pneumothorax. Probable scarring anteriorly in the lingula. Minimal bronchial thickening. No acute osseous abnormalities are seen. IMPRESSION: Bronchial thickening, may be infectious or inflammatory. Electronically Signed   By: Rubye OaksMelanie  Ehinger M.D.   On: 08/01/2016 21:37   [place] Renal  Result Date: 08/02/2016 CLINICAL DATA:  Status post right nephrectomy 2002 EXAM: RENAL / URINARY TRACT ULTRASOUND COMPLETE COMPARISON:  12/17/2009 CT scan abdomen and pelvis FINDINGS: Right Kidney: Surgically absent Left Kidney: Length: 12.4 cm. There is normal echogenicity. A medial cyst in upper pole measures 5.3 x 3.9 x 3.4 cm on the prior CT scan measures 2 3.2 cm. There is a cyst in midpole measures 4.2 by 4 cm. On the prior CT scan measured 2.4 cm. No renal calculi.  No hydronephrosis. Bladder: Appears normal for degree of bladder distention. Left ureteral jet is visualized. IMPRESSION: 1. Status post right nephrectomy. 2. No left hydronephrosis or renal calculi. There is a cyst in upper pole measures 5.3 by 3.9 cm on the prior exam measures 3.2 cm. Cyst in midpole measures 4.2 x 4 cm on the prior exam measures 2.4 cm. 3. Unremarkable urinary bladder.  Left ureteral jet is visualized Electronically Signed   By: Natasha Mead M.D.   On: 08/02/2016 11:57     Medications:   . sodium chloride 75 mL/hr at 08/03/16 0348  . heparin 950 Units/hr  (08/03/16 0952)   . aspirin EC  81 mg Oral Daily  . NIFEdipine  90 mg Oral Daily  . pantoprazole  40 mg Oral Daily  . pneumococcal 23 valent vaccine  0.5 mL Intramuscular Tomorrow-1000  . ramipril  10 mg Oral Daily  . sodium chloride flush  3 mL Intravenous Q12H   acetaminophen **OR** acetaminophen, hydrALAZINE, labetalol, ondansetron **OR** ondansetron (ZOFRAN) IV, oxyCODONE  Assessment/ Plan:  Mr. LANDYNN DUPLER is a 71 y.o.  male Mr. RODRIGUEZ AGUINALDO is a 71 y.o. black male with history of left nephrectomy 2002 Dr. Evelene Croon, hypertension and GERD, who was admitted to [facility] on 08/01/2016 for Atypical chest pain [R07.89] Hypertensive urgency [I16.0]   1. Acute renal failure versus chronic kidney disease stage IV: baseline creatinine 1.87, GFR of 42 from 05/2014.   With solitary kidney. Chronic kidney disease secondary to hypertension.  - Continue IV NS  - Discussed chronic kidney disease with patient and wife.  - will need outpatient follow up  2. Hypertension: with urgency. Home regimen of ramipril and nifedipine. Blood pressure better controlled. 169/93 - IV hydralazine PRN - nifedipine and ramipril restarted  3. Hypokalemia: potassium 3.1 - oral replacement ordered  4. Chest pain: appreciate cardiology input. Scheduled for stress test. If catheterization required, will hold ramipril and continue IV fluids.    LOS: 1 Deshia Vanderhoof 3/1/201810:19 AM

## 2016-08-03 NOTE — Plan of Care (Signed)
Problem: Cardiac: Goal: Ability to achieve and maintain adequate cardiovascular perfusion will improve Outcome: Completed/Met Date Met: 08/03/16 No c/o of chest pain

## 2016-08-04 NOTE — Discharge Summary (Signed)
[facility] [place] [facility]   PATIENT NAME: Adrian Blevins    MR#:  [id number]  DATE OF BIRTH:  [birth year]  DATE OF ADMISSION:  08/01/2016 ADMITTING PHYSICIAN: Oralia Manis, MD  DATE OF DISCHARGE: 08/03/2016  4:37 PM  PRIMARY CARE PHYSICIAN: Kelton Pillar, MD    ADMISSION DIAGNOSIS:  Atypical chest pain [R07.89] Hypertensive urgency [I16.0]  DISCHARGE DIAGNOSIS:  Principal Problem:   Chest pain Active Problems:   Accelerated hypertension   GERD (gastroesophageal reflux disease)   AKI (acute kidney injury) (HCC)   NSTEMI (non-ST elevated myocardial infarction) (HCC)   SECONDARY DIAGNOSIS:   Past Medical History:  Diagnosis Date  . GERD (gastroesophageal reflux disease)   . Hypertension     HOSPITAL COURSE:   71 year old male with past medical history of renal cell carcinoma status post right-sided nephrectomy, hypertension, GERD who presented to the hospital due to chest pain.  1. Non-ST elevation MI-patient has ruled in by cardiac markers as his troponin have peaked as high as 1. -Patient was seen by cardiology and started on medical management with aspirin, heparin drip, beta blocker and low-dose ACE inhibitor. He was clinically asymptomatic and therefore cardiology decided to do a nuclear medicine stress test given his chronic kidney disease. -Patient underwent a nuclear medicine stress test which showed no evidence of acute myocardial ischemia or wall motion abnormalities. They recommended medical management with aspirin, beta blocker, ACE inhibitor, statin and follow-up with cardiology as an outpatient.  2. Acute on chronic renal failure - baseline Cr. As per Renal around 1.8 and he presented to the hospital with a creatinine as high as 2.9. Patient was gently hydrated with IV fluids and his creatinine did trend down. His renal ultrasound while in the hospital showed no evidence of hydronephrosis. He was seen by nephrology and he recommended  outpatient follow-up.  - he will follow up with Dr. Gweneth Dimitri in 2 weeks.   3. Accelerated HTN - cont. Ramipril, Nifedipine, low dose Coreg and Imdur was added to his Regimen upon discharge.  - hx BP improved w/ some PRN meds while in the hospital.    DISCHARGE CONDITIONS:   Stable  CONSULTS OBTAINED:  Treatment Team:  Lamont Dowdy, MD Alwyn Pea, MD  DRUG ALLERGIES:  No Known Allergies  DISCHARGE MEDICATIONS:   Allergies as of 08/03/2016   No Known Allergies     Medication List    TAKE these medications   aspirin EC 81 MG tablet Take 81 mg by mouth daily.   carvedilol 3.125 MG tablet Commonly known as:  COREG Take 1 tablet (3.125 mg total) by mouth 2 (two) times daily with a meal.   isosorbide mononitrate 30 MG 24 hr tablet Commonly known as:  IMDUR Take 1 tablet (30 mg total) by mouth daily.   multivitamin with minerals Tabs tablet Take 1 tablet by mouth daily.   NIFEdipine 90 MG 24 hr tablet Commonly known as:  PROCARDIA XL/ADALAT-CC Take 90 mg by mouth daily.   ramipril 10 MG capsule Commonly known as:  ALTACE Take 10 mg by mouth daily.         DISCHARGE INSTRUCTIONS:   DIET:  Cardiac diet  DISCHARGE CONDITION:  Stable  ACTIVITY:  Activity as tolerated  OXYGEN:  Home Oxygen: No.   Oxygen Delivery: room air  DISCHARGE LOCATION:  home   If you experience worsening of your admission symptoms, develop shortness of breath, life threatening emergency, suicidal or homicidal thoughts you must seek  medical attention immediately by calling 911 or calling your MD immediately  if symptoms less severe.  You Must read complete instructions/literature along with all the possible adverse reactions/side effects for all the Medicines you take and that have been prescribed to you. Take any new Medicines after you have completely understood and accpet all the possible adverse reactions/side effects.   Please note  You were cared for by a  hospitalist during your hospital stay. If you have any questions about your discharge medications or the care you received while you were in the hospital after you are discharged, you can call the unit and asked to speak with the hospitalist on call if the hospitalist that took care of you is not available. Once you are discharged, your primary care physician will handle any further medical issues. Please note that NO REFILLS for any discharge medications will be authorized once you are discharged, as it is imperative that you return to your primary care physician (or establish a relationship with a primary care physician if you do not have one) for your aftercare needs so that they can reassess your need for medications and monitor your lab values.     Today   No chest pain, shortness of breath.  BP improved. Family at bedside.  No new complaints.   VITAL SIGNS:  Blood pressure (!) 169/87, pulse 86, temperature 98.7 F (37.1 C), temperature source Oral, resp. rate 16, height 5\' 8"  (1.727 m), weight 93 kg (205 lb), SpO2 97 %.  I/O:  No intake or output data in the 24 hours ending 08/04/16 1547  PHYSICAL EXAMINATION:  GENERAL:  71 y.o.-year-old patient lying in the bed with no acute distress.  EYES: Pupils equal, round, reactive to light and accommodation. No scleral icterus. Extraocular muscles intact.  HEENT: Head atraumatic, normocephalic. Oropharynx and nasopharynx clear.  NECK:  Supple, no jugular venous distention. No thyroid enlargement, no tenderness.  LUNGS: Normal breath sounds bilaterally, no wheezing, rales,rhonchi. No use of accessory muscles of respiration.  CARDIOVASCULAR: S1, S2 normal. No murmurs, rubs, or gallops.  ABDOMEN: Soft, non-tender, non-distended. Bowel sounds present. No organomegaly or mass.  EXTREMITIES: No pedal edema, cyanosis, or clubbing.  NEUROLOGIC: Cranial nerves II through XII are intact. No focal motor or sensory defecits b/l.  PSYCHIATRIC: The patient  is alert and oriented x 3. SKIN: No obvious rash, lesion, or ulcer.   DATA REVIEW:   CBC  Recent Labs Lab 08/02/16 0756  WBC 6.2  HGB 13.9  HCT 41.6  PLT 162    Chemistries   Recent Labs Lab 08/03/16 0513  NA 140  K 3.1*  CL 102  CO2 28  GLUCOSE 108*  BUN 24*  CREATININE 2.41*  CALCIUM 8.6*    Cardiac Enzymes  Recent Labs Lab 08/02/16 1353  TROPONINI 1.30*    Microbiology Results  No results found for this or any previous visit.  RADIOLOGY:  Nm Myocar Multi W/spect W/wall Motion / Ef  Result Date: 08/03/2016  Blood pressure demonstrated a normal response to exercise.  There was no ST segment deviation noted during stress.  Defect 1: There is a small defect of mild severity present in the mid anterior location.  Findings consistent with ischemia.  This is an intermediate risk study.  The left ventricular ejection fraction is moderately decreased (30-44%).       Management plans discussed with the patient, family and they are in agreement.  CODE STATUS:  Code Status History    Date  Active Date Inactive Code Status Order ID Comments User Context   08/02/2016  1:59 AM 08/03/2016  7:42 PM Full Code [id number]  Oralia Manis, MD Inpatient      TOTAL TIME TAKING CARE OF THIS PATIENT: 40 minutes.    Houston Siren M.D on 08/04/2016 at 3:47 PM  Between 7am to 6pm - Pager - [phone]  After 6pm go to [email or web address] - Social research officer, government  [facility]  Office  [phone]  CC: Primary care physician; Kelton Pillar, MD

## 2016-08-11 DIAGNOSIS — K219 Gastro-esophageal reflux disease without esophagitis: Secondary | ICD-10-CM | POA: Diagnosis not present

## 2016-08-11 DIAGNOSIS — N189 Chronic kidney disease, unspecified: Secondary | ICD-10-CM | POA: Diagnosis not present

## 2016-08-11 DIAGNOSIS — R1310 Dysphagia, unspecified: Secondary | ICD-10-CM | POA: Diagnosis not present

## 2016-08-11 DIAGNOSIS — I10 Essential (primary) hypertension: Secondary | ICD-10-CM | POA: Diagnosis not present

## 2016-08-11 DIAGNOSIS — R079 Chest pain, unspecified: Secondary | ICD-10-CM | POA: Diagnosis not present

## 2016-08-11 DIAGNOSIS — E876 Hypokalemia: Secondary | ICD-10-CM | POA: Diagnosis not present

## 2016-08-11 DIAGNOSIS — R4702 Dysphasia: Secondary | ICD-10-CM | POA: Diagnosis not present

## 2016-09-29 DIAGNOSIS — N184 Chronic kidney disease, stage 4 (severe): Secondary | ICD-10-CM | POA: Diagnosis not present

## 2016-09-29 DIAGNOSIS — I129 Hypertensive chronic kidney disease with stage 1 through stage 4 chronic kidney disease, or unspecified chronic kidney disease: Secondary | ICD-10-CM | POA: Diagnosis not present

## 2016-09-29 DIAGNOSIS — R809 Proteinuria, unspecified: Secondary | ICD-10-CM | POA: Diagnosis not present

## 2016-09-29 DIAGNOSIS — E876 Hypokalemia: Secondary | ICD-10-CM | POA: Diagnosis not present

## 2016-09-29 DIAGNOSIS — M109 Gout, unspecified: Secondary | ICD-10-CM | POA: Diagnosis not present

## 2016-09-29 DIAGNOSIS — N2581 Secondary hyperparathyroidism of renal origin: Secondary | ICD-10-CM | POA: Diagnosis not present

## 2017-01-12 DIAGNOSIS — N184 Chronic kidney disease, stage 4 (severe): Secondary | ICD-10-CM | POA: Diagnosis not present

## 2017-01-12 DIAGNOSIS — N2581 Secondary hyperparathyroidism of renal origin: Secondary | ICD-10-CM | POA: Diagnosis not present

## 2017-01-12 DIAGNOSIS — M109 Gout, unspecified: Secondary | ICD-10-CM | POA: Diagnosis not present

## 2017-01-12 DIAGNOSIS — I129 Hypertensive chronic kidney disease with stage 1 through stage 4 chronic kidney disease, or unspecified chronic kidney disease: Secondary | ICD-10-CM | POA: Diagnosis not present

## 2017-01-12 DIAGNOSIS — R809 Proteinuria, unspecified: Secondary | ICD-10-CM | POA: Diagnosis not present

## 2017-02-07 DIAGNOSIS — M1000 Idiopathic gout, unspecified site: Secondary | ICD-10-CM | POA: Diagnosis not present

## 2017-02-07 DIAGNOSIS — M25561 Pain in right knee: Secondary | ICD-10-CM | POA: Diagnosis not present

## 2017-02-23 DIAGNOSIS — M1000 Idiopathic gout, unspecified site: Secondary | ICD-10-CM | POA: Diagnosis not present

## 2017-02-23 DIAGNOSIS — M25561 Pain in right knee: Secondary | ICD-10-CM | POA: Diagnosis not present

## 2017-03-16 DIAGNOSIS — M25561 Pain in right knee: Secondary | ICD-10-CM | POA: Diagnosis not present

## 2017-04-06 DIAGNOSIS — I10 Essential (primary) hypertension: Secondary | ICD-10-CM | POA: Diagnosis not present

## 2017-04-06 DIAGNOSIS — N2581 Secondary hyperparathyroidism of renal origin: Secondary | ICD-10-CM | POA: Diagnosis not present

## 2017-04-06 DIAGNOSIS — N183 Chronic kidney disease, stage 3 (moderate): Secondary | ICD-10-CM | POA: Diagnosis not present

## 2017-04-06 DIAGNOSIS — R600 Localized edema: Secondary | ICD-10-CM | POA: Diagnosis not present

## 2017-04-06 DIAGNOSIS — R809 Proteinuria, unspecified: Secondary | ICD-10-CM | POA: Diagnosis not present

## 2017-05-04 DIAGNOSIS — I10 Essential (primary) hypertension: Secondary | ICD-10-CM | POA: Diagnosis not present

## 2017-05-04 DIAGNOSIS — N2581 Secondary hyperparathyroidism of renal origin: Secondary | ICD-10-CM | POA: Diagnosis not present

## 2017-05-04 DIAGNOSIS — R809 Proteinuria, unspecified: Secondary | ICD-10-CM | POA: Diagnosis not present

## 2017-05-04 DIAGNOSIS — R601 Generalized edema: Secondary | ICD-10-CM | POA: Diagnosis not present

## 2017-05-04 DIAGNOSIS — N183 Chronic kidney disease, stage 3 (moderate): Secondary | ICD-10-CM | POA: Diagnosis not present

## 2017-06-08 DIAGNOSIS — R809 Proteinuria, unspecified: Secondary | ICD-10-CM | POA: Diagnosis not present

## 2017-06-08 DIAGNOSIS — R600 Localized edema: Secondary | ICD-10-CM | POA: Diagnosis not present

## 2017-06-08 DIAGNOSIS — I10 Essential (primary) hypertension: Secondary | ICD-10-CM | POA: Diagnosis not present

## 2017-06-08 DIAGNOSIS — N183 Chronic kidney disease, stage 3 (moderate): Secondary | ICD-10-CM | POA: Diagnosis not present

## 2017-06-28 DIAGNOSIS — H903 Sensorineural hearing loss, bilateral: Secondary | ICD-10-CM | POA: Diagnosis not present

## 2017-06-28 DIAGNOSIS — H9319 Tinnitus, unspecified ear: Secondary | ICD-10-CM | POA: Diagnosis not present

## 2017-09-07 DIAGNOSIS — I129 Hypertensive chronic kidney disease with stage 1 through stage 4 chronic kidney disease, or unspecified chronic kidney disease: Secondary | ICD-10-CM | POA: Diagnosis not present

## 2017-09-07 DIAGNOSIS — N183 Chronic kidney disease, stage 3 (moderate): Secondary | ICD-10-CM | POA: Diagnosis not present

## 2017-09-07 DIAGNOSIS — N2581 Secondary hyperparathyroidism of renal origin: Secondary | ICD-10-CM | POA: Diagnosis not present

## 2017-09-07 DIAGNOSIS — Z905 Acquired absence of kidney: Secondary | ICD-10-CM | POA: Diagnosis not present

## 2017-09-07 DIAGNOSIS — R809 Proteinuria, unspecified: Secondary | ICD-10-CM | POA: Diagnosis not present

## 2017-12-14 DIAGNOSIS — R809 Proteinuria, unspecified: Secondary | ICD-10-CM | POA: Diagnosis not present

## 2017-12-14 DIAGNOSIS — N183 Chronic kidney disease, stage 3 (moderate): Secondary | ICD-10-CM | POA: Diagnosis not present

## 2017-12-14 DIAGNOSIS — Z905 Acquired absence of kidney: Secondary | ICD-10-CM | POA: Diagnosis not present

## 2017-12-14 DIAGNOSIS — I10 Essential (primary) hypertension: Secondary | ICD-10-CM | POA: Diagnosis not present

## 2018-01-04 DIAGNOSIS — I429 Cardiomyopathy, unspecified: Secondary | ICD-10-CM | POA: Diagnosis not present

## 2018-01-04 DIAGNOSIS — N183 Chronic kidney disease, stage 3 (moderate): Secondary | ICD-10-CM | POA: Diagnosis not present

## 2018-01-04 DIAGNOSIS — I248 Other forms of acute ischemic heart disease: Secondary | ICD-10-CM | POA: Diagnosis not present

## 2018-01-04 DIAGNOSIS — R748 Abnormal levels of other serum enzymes: Secondary | ICD-10-CM | POA: Diagnosis not present

## 2018-01-04 DIAGNOSIS — K219 Gastro-esophageal reflux disease without esophagitis: Secondary | ICD-10-CM | POA: Diagnosis not present

## 2018-01-04 DIAGNOSIS — I10 Essential (primary) hypertension: Secondary | ICD-10-CM | POA: Diagnosis not present

## 2018-01-08 DIAGNOSIS — T18128A Food in esophagus causing other injury, initial encounter: Secondary | ICD-10-CM | POA: Diagnosis not present

## 2018-01-18 DIAGNOSIS — H25813 Combined forms of age-related cataract, bilateral: Secondary | ICD-10-CM | POA: Diagnosis not present

## 2019-03-03 DIAGNOSIS — I10 Essential (primary) hypertension: Secondary | ICD-10-CM | POA: Diagnosis not present

## 2019-03-03 DIAGNOSIS — I214 Non-ST elevation (NSTEMI) myocardial infarction: Secondary | ICD-10-CM | POA: Diagnosis not present

## 2019-03-03 DIAGNOSIS — I110 Hypertensive heart disease with heart failure: Secondary | ICD-10-CM | POA: Diagnosis not present

## 2019-03-03 DIAGNOSIS — I252 Old myocardial infarction: Secondary | ICD-10-CM | POA: Diagnosis not present

## 2019-03-03 DIAGNOSIS — Z0000 Encounter for general adult medical examination without abnormal findings: Secondary | ICD-10-CM | POA: Diagnosis not present

## 2019-03-03 DIAGNOSIS — M1000 Idiopathic gout, unspecified site: Secondary | ICD-10-CM | POA: Diagnosis not present

## 2019-03-03 DIAGNOSIS — N289 Disorder of kidney and ureter, unspecified: Secondary | ICD-10-CM | POA: Diagnosis not present

## 2019-03-03 DIAGNOSIS — M109 Gout, unspecified: Secondary | ICD-10-CM | POA: Diagnosis not present

## 2019-03-03 DIAGNOSIS — I5022 Chronic systolic (congestive) heart failure: Secondary | ICD-10-CM | POA: Diagnosis not present

## 2019-03-24 DIAGNOSIS — I129 Hypertensive chronic kidney disease with stage 1 through stage 4 chronic kidney disease, or unspecified chronic kidney disease: Secondary | ICD-10-CM | POA: Diagnosis not present

## 2019-03-24 DIAGNOSIS — N2581 Secondary hyperparathyroidism of renal origin: Secondary | ICD-10-CM | POA: Diagnosis not present

## 2019-03-24 DIAGNOSIS — M10041 Idiopathic gout, right hand: Secondary | ICD-10-CM | POA: Diagnosis not present

## 2019-03-24 DIAGNOSIS — N1832 Chronic kidney disease, stage 3b: Secondary | ICD-10-CM | POA: Diagnosis not present

## 2019-03-24 DIAGNOSIS — R808 Other proteinuria: Secondary | ICD-10-CM | POA: Diagnosis not present

## 2019-04-07 DIAGNOSIS — N189 Chronic kidney disease, unspecified: Secondary | ICD-10-CM | POA: Diagnosis not present

## 2019-04-07 DIAGNOSIS — Z8679 Personal history of other diseases of the circulatory system: Secondary | ICD-10-CM | POA: Diagnosis not present

## 2019-04-07 DIAGNOSIS — I130 Hypertensive heart and chronic kidney disease with heart failure and stage 1 through stage 4 chronic kidney disease, or unspecified chronic kidney disease: Secondary | ICD-10-CM | POA: Diagnosis not present

## 2019-04-07 DIAGNOSIS — M1A9XX1 Chronic gout, unspecified, with tophus (tophi): Secondary | ICD-10-CM | POA: Diagnosis not present

## 2019-04-07 DIAGNOSIS — I509 Heart failure, unspecified: Secondary | ICD-10-CM | POA: Diagnosis not present

## 2019-06-30 DIAGNOSIS — M1000 Idiopathic gout, unspecified site: Secondary | ICD-10-CM | POA: Diagnosis not present

## 2019-06-30 DIAGNOSIS — I10 Essential (primary) hypertension: Secondary | ICD-10-CM | POA: Diagnosis not present

## 2019-07-08 DIAGNOSIS — I5022 Chronic systolic (congestive) heart failure: Secondary | ICD-10-CM | POA: Diagnosis not present

## 2019-07-08 DIAGNOSIS — M109 Gout, unspecified: Secondary | ICD-10-CM | POA: Diagnosis not present

## 2019-07-08 DIAGNOSIS — I2510 Atherosclerotic heart disease of native coronary artery without angina pectoris: Secondary | ICD-10-CM | POA: Diagnosis not present

## 2019-07-08 DIAGNOSIS — K219 Gastro-esophageal reflux disease without esophagitis: Secondary | ICD-10-CM | POA: Diagnosis not present

## 2019-07-08 DIAGNOSIS — N289 Disorder of kidney and ureter, unspecified: Secondary | ICD-10-CM | POA: Diagnosis not present

## 2019-07-08 DIAGNOSIS — I10 Essential (primary) hypertension: Secondary | ICD-10-CM | POA: Diagnosis not present

## 2019-07-08 DIAGNOSIS — R5383 Other fatigue: Secondary | ICD-10-CM | POA: Diagnosis not present

## 2019-07-10 DIAGNOSIS — Z79899 Other long term (current) drug therapy: Secondary | ICD-10-CM | POA: Diagnosis not present

## 2019-07-10 DIAGNOSIS — U071 COVID-19: Secondary | ICD-10-CM | POA: Diagnosis not present

## 2019-07-30 DIAGNOSIS — M1000 Idiopathic gout, unspecified site: Secondary | ICD-10-CM | POA: Diagnosis not present

## 2019-07-30 DIAGNOSIS — I130 Hypertensive heart and chronic kidney disease with heart failure and stage 1 through stage 4 chronic kidney disease, or unspecified chronic kidney disease: Secondary | ICD-10-CM | POA: Diagnosis not present

## 2019-07-30 DIAGNOSIS — N189 Chronic kidney disease, unspecified: Secondary | ICD-10-CM | POA: Diagnosis not present

## 2019-07-30 DIAGNOSIS — I2510 Atherosclerotic heart disease of native coronary artery without angina pectoris: Secondary | ICD-10-CM | POA: Diagnosis not present

## 2019-09-03 DIAGNOSIS — M1000 Idiopathic gout, unspecified site: Secondary | ICD-10-CM | POA: Diagnosis not present

## 2019-09-03 DIAGNOSIS — I10 Essential (primary) hypertension: Secondary | ICD-10-CM | POA: Diagnosis not present

## 2019-09-03 DIAGNOSIS — N1832 Chronic kidney disease, stage 3b: Secondary | ICD-10-CM | POA: Diagnosis not present

## 2019-09-10 DIAGNOSIS — I509 Heart failure, unspecified: Secondary | ICD-10-CM | POA: Diagnosis not present

## 2019-09-10 DIAGNOSIS — I130 Hypertensive heart and chronic kidney disease with heart failure and stage 1 through stage 4 chronic kidney disease, or unspecified chronic kidney disease: Secondary | ICD-10-CM | POA: Diagnosis not present

## 2019-09-10 DIAGNOSIS — M109 Gout, unspecified: Secondary | ICD-10-CM | POA: Diagnosis not present

## 2019-09-10 DIAGNOSIS — N189 Chronic kidney disease, unspecified: Secondary | ICD-10-CM | POA: Diagnosis not present

## 2019-09-29 DIAGNOSIS — N1832 Chronic kidney disease, stage 3b: Secondary | ICD-10-CM | POA: Diagnosis not present

## 2019-09-29 DIAGNOSIS — R808 Other proteinuria: Secondary | ICD-10-CM | POA: Diagnosis not present

## 2019-09-29 DIAGNOSIS — N281 Cyst of kidney, acquired: Secondary | ICD-10-CM | POA: Diagnosis not present

## 2019-09-29 DIAGNOSIS — N2581 Secondary hyperparathyroidism of renal origin: Secondary | ICD-10-CM | POA: Diagnosis not present

## 2019-09-29 DIAGNOSIS — E790 Hyperuricemia without signs of inflammatory arthritis and tophaceous disease: Secondary | ICD-10-CM | POA: Diagnosis not present

## 2019-09-29 DIAGNOSIS — I10 Essential (primary) hypertension: Secondary | ICD-10-CM | POA: Diagnosis not present

## 2019-10-11 ENCOUNTER — Ambulatory Visit: Payer: Medicare HMO

## 2020-01-09 DIAGNOSIS — I10 Essential (primary) hypertension: Secondary | ICD-10-CM | POA: Diagnosis not present

## 2020-01-09 DIAGNOSIS — N1832 Chronic kidney disease, stage 3b: Secondary | ICD-10-CM | POA: Diagnosis not present

## 2020-01-09 DIAGNOSIS — M109 Gout, unspecified: Secondary | ICD-10-CM | POA: Diagnosis not present

## 2020-01-16 DIAGNOSIS — N189 Chronic kidney disease, unspecified: Secondary | ICD-10-CM | POA: Diagnosis not present

## 2020-01-16 DIAGNOSIS — M109 Gout, unspecified: Secondary | ICD-10-CM | POA: Diagnosis not present

## 2020-01-16 DIAGNOSIS — I130 Hypertensive heart and chronic kidney disease with heart failure and stage 1 through stage 4 chronic kidney disease, or unspecified chronic kidney disease: Secondary | ICD-10-CM | POA: Diagnosis not present

## 2020-01-16 DIAGNOSIS — I2510 Atherosclerotic heart disease of native coronary artery without angina pectoris: Secondary | ICD-10-CM | POA: Diagnosis not present

## 2020-01-16 DIAGNOSIS — Z0000 Encounter for general adult medical examination without abnormal findings: Secondary | ICD-10-CM | POA: Diagnosis not present

## 2020-01-16 DIAGNOSIS — I509 Heart failure, unspecified: Secondary | ICD-10-CM | POA: Diagnosis not present

## 2020-07-23 DIAGNOSIS — N189 Chronic kidney disease, unspecified: Secondary | ICD-10-CM | POA: Diagnosis not present

## 2020-07-23 DIAGNOSIS — M109 Gout, unspecified: Secondary | ICD-10-CM | POA: Diagnosis not present

## 2020-07-23 DIAGNOSIS — G5601 Carpal tunnel syndrome, right upper limb: Secondary | ICD-10-CM | POA: Diagnosis not present

## 2020-07-23 DIAGNOSIS — Z1212 Encounter for screening for malignant neoplasm of rectum: Secondary | ICD-10-CM | POA: Diagnosis not present

## 2020-07-23 DIAGNOSIS — I509 Heart failure, unspecified: Secondary | ICD-10-CM | POA: Diagnosis not present

## 2020-07-23 DIAGNOSIS — I130 Hypertensive heart and chronic kidney disease with heart failure and stage 1 through stage 4 chronic kidney disease, or unspecified chronic kidney disease: Secondary | ICD-10-CM | POA: Diagnosis not present

## 2020-07-23 DIAGNOSIS — Z0000 Encounter for general adult medical examination without abnormal findings: Secondary | ICD-10-CM | POA: Diagnosis not present

## 2020-07-23 DIAGNOSIS — Z1211 Encounter for screening for malignant neoplasm of colon: Secondary | ICD-10-CM | POA: Diagnosis not present

## 2020-07-23 DIAGNOSIS — I2510 Atherosclerotic heart disease of native coronary artery without angina pectoris: Secondary | ICD-10-CM | POA: Diagnosis not present

## 2020-08-06 DIAGNOSIS — G5601 Carpal tunnel syndrome, right upper limb: Secondary | ICD-10-CM | POA: Diagnosis not present

## 2020-08-06 DIAGNOSIS — R202 Paresthesia of skin: Secondary | ICD-10-CM | POA: Diagnosis not present

## 2020-08-06 DIAGNOSIS — R200 Anesthesia of skin: Secondary | ICD-10-CM | POA: Diagnosis not present

## 2020-11-05 DIAGNOSIS — G5601 Carpal tunnel syndrome, right upper limb: Secondary | ICD-10-CM | POA: Diagnosis not present

## 2021-01-06 DIAGNOSIS — H6123 Impacted cerumen, bilateral: Secondary | ICD-10-CM | POA: Diagnosis not present

## 2021-01-06 DIAGNOSIS — H903 Sensorineural hearing loss, bilateral: Secondary | ICD-10-CM | POA: Diagnosis not present

## 2021-01-07 DIAGNOSIS — G5601 Carpal tunnel syndrome, right upper limb: Secondary | ICD-10-CM | POA: Diagnosis not present

## 2021-02-04 ENCOUNTER — Encounter: Payer: Self-pay | Admitting: Occupational Therapy

## 2021-02-04 ENCOUNTER — Other Ambulatory Visit: Payer: Self-pay

## 2021-02-04 ENCOUNTER — Ambulatory Visit: Payer: Medicare HMO | Attending: Orthopedic Surgery | Admitting: Occupational Therapy

## 2021-02-04 DIAGNOSIS — L905 Scar conditions and fibrosis of skin: Secondary | ICD-10-CM | POA: Diagnosis not present

## 2021-02-04 DIAGNOSIS — M25631 Stiffness of right wrist, not elsewhere classified: Secondary | ICD-10-CM | POA: Insufficient documentation

## 2021-02-04 DIAGNOSIS — M25641 Stiffness of right hand, not elsewhere classified: Secondary | ICD-10-CM | POA: Diagnosis not present

## 2021-02-04 DIAGNOSIS — M6281 Muscle weakness (generalized): Secondary | ICD-10-CM

## 2021-02-04 DIAGNOSIS — R208 Other disturbances of skin sensation: Secondary | ICD-10-CM | POA: Insufficient documentation

## 2021-02-04 NOTE — Therapy (Signed)
[facility] [address] [place], [address] Phone: [phone]   Fax:  [phone]  Occupational Therapy Evaluation  Patient Details  Name: Adrian Blevins MRN: [id number] Date of Birth: 1946/01/11 Referring Provider (OT): Floyce Stakes   Encounter Date: 02/04/2021   OT End of Session - 02/04/21 1245     Visit Number 1    Number of Visits 6    Date for OT Re-Evaluation 03/18/21    OT Start Time 1133    OT Stop Time 1217    OT Time Calculation (min) 44 min    Activity Tolerance Patient tolerated treatment well    Behavior During Therapy [facility] for tasks assessed/performed             Past Medical History:  Diagnosis Date   GERD (gastroesophageal reflux disease)    Hypertension     Past Surgical History:  Procedure Laterality Date   NEPHRECTOMY Left     There were no vitals filed for this visit.   Subjective Assessment - 02/04/21 1238     Subjective  Still having numbness in my hand, swelling and stiffness- cannot make fist - and no able to use it alot- using my L hand mostly    Pertinent History Pt had CTR on 01/07/21 with suture removal on 01/21/21 and refer to OT -per pt had gout about 2 wks ago and was on steriod for week    Patient Stated Goals Want the numbness and motion in my hand to get better to grip and use my R hand    Currently in Pain? No/denies               [facility] OT Assessment - 02/04/21 0001       Assessment   Medical Diagnosis R CTR    Referring Provider (OT) Floyce Stakes    Onset Date/Surgical Date 01/07/21    Hand Dominance Right      Prior Function   Vocation Full time employment    Leisure commute to [place] 4 x wk - work [employer] , play some pool      AROM   Right Wrist Extension 35 Degrees    Right Wrist Flexion 60 Degrees    Left Wrist Extension 70 Degrees    Left Wrist Flexion 80 Degrees      Right Hand AROM   R Thumb MCP 0-60 50 Degrees    R Thumb IP  0-80 20 Degrees    R Thumb Radial ABduction/ADduction 0-55 44    R Thumb Palmar ABduction/ADduction 0-45 44    R Thumb Opposition to Index --   Opposition to 2nd - side of 3rd -   R Index  MCP 0-90 70 Degrees    R Index PIP 0-100 50 Degrees    R Long  MCP 0-90 80 Degrees    R Long PIP 0-100 50 Degrees   -30 ext   R Ring  MCP 0-90 80 Degrees    R Ring PIP 0-100 70 Degrees    R Little  MCP 0-90 90 Degrees    R Little PIP 0-100 80 Degrees                      OT Treatments/Exercises (OP) - 02/04/21 0001       RUE Contrast Bath   Time 8 minutes    Comments prior to review of HEP - decrease edema and stiffness  Review with pt HEP  2-3 x day contrast and Isotoner glove fitted to use night time and some during day Scar massage  Blocked tendon glides- full fist to wide and then narrow green block  10 reps  AAROM for thumb PA and RA  Composite flexion of thumb PROM  Opposition pick up 1 cm foam block - alternate digits  10 reps PROM for wrist ext and flexion - 10reps Hold 3-5 sec        OT Education - 02/04/21 1245     Education Details findings of eval and HEP    Person(s) Educated Patient    Methods Explanation;Demonstration;Tactile cues;Verbal cues;Handout    Comprehension Verbal cues required;Returned demonstration;Verbalized understanding              OT Short Term Goals - 02/04/21 1249       OT SHORT TERM GOAL #1   Title Pt to be ind in HEP to decrease edema and numbness and increase AROM in R digits and wrist    Baseline edema , numbness in volar palm into thumb thru 3rd , and radial side of 4th - Tinel at base of palm, wrist et 35, flexion 60 - decrease flexion of 2nd and 3rd more than 4th an 5th - PIP 50 -80    Time 3    Period Weeks    Status New    Target Date 02/25/21               OT Long Term Goals - 02/04/21 1251       OT LONG TERM GOAL #1   Title R hand digits flexion increase for pt to touch palm to grip  utencils and change gears and pool stick    Baseline Flexion decrease MC's 70-90 and PIP's 50-80 - digits decreae in all planes - PA and RA 44, IP 20, MC 50    Time 5    Period Weeks    Status New    Target Date 03/11/21      OT LONG TERM GOAL #2   Title R grip strength and prehension strength more than 50% compare to L hand  to squeeze washcloth, hold steeringwheel , use mouse on computer    Baseline use L hand for changing gears, mouse on computer , cannot grip object    Time 6    Period Weeks    Status New    Target Date 03/18/21      OT LONG TERM GOAL #3   Title Sensation in palm and digits improve to at least 3.61 on semmes weinstein to report incrase use of R hand on computer    Baseline use mostly L hand on computer and mouse per pt- and Semmes wienstein to be assess next viit-s palm and thumb thru radial side of 4th numbness    Time 6    Period Weeks    Status New    Target Date 03/18/21                   Plan - 02/04/21 1246     Clinical Impression Statement Pt present at OT eval s/p R CTR on 01/07/21 - pt now 4 wks s/p - pt report he has hx of gout and had episode about 2 wks ago - and cont to have increase edema , stiffness and numbness- did not do any scar massage this far - was about 2 wks ago on prednisone- pt with severe stiffness in thumb ,  2nd and 3rd digits - numbness report in palm thumb thru 3rd digits and radial side of 4th - decrease strength - all limiting his functional use of R dominant hand in ADL's and IADL's    OT Occupational Profile and History Problem Focused Assessment - Including review of records relating to presenting problem    Occupational performance deficits (Please refer to evaluation for details): ADL's;IADL's;Work;Play;Leisure;Social Participation    Body Structure / Function / Physical Skills ADL;Strength;Pain;Dexterity;Edema;UE functional use;ROM;Scar mobility;Sensation;FMC;Coordination;Flexibility    Rehab Potential Fair    Clinical  Decision Making Limited treatment options, no task modification necessary    Comorbidities Affecting Occupational Performance: None    Modification or Assistance to Complete Evaluation  No modification of tasks or assist necessary to complete eval    OT Frequency 1x / week    OT Duration 6 weeks    OT Treatment/Interventions Self-care/ADL training;Fluidtherapy;DME and/or AE instruction;Contrast Bath;Therapeutic exercise;Scar mobilization;Passive range of motion;Paraffin;Manual Therapy;Patient/family education    Consulted and Agree with Plan of Care Patient             Patient will benefit from skilled therapeutic intervention in order to improve the following deficits and impairments:   Body Structure / Function / Physical Skills: ADL, Strength, Pain, Dexterity, Edema, UE functional use, ROM, Scar mobility, Sensation, FMC, Coordination, Flexibility       Visit Diagnosis: Stiffness of right hand, not elsewhere classified - Plan: Ot plan of care cert/re-cert  Stiffness of right wrist, not elsewhere classified - Plan: Ot plan of care cert/re-cert  Scar condition and fibrosis of skin - Plan: Ot plan of care cert/re-cert  Other disturbances of skin sensation - Plan: Ot plan of care cert/re-cert  Muscle weakness (generalized) - Plan: Ot plan of care cert/re-cert    Problem List Patient Active Problem List   Diagnosis Date Noted   NSTEMI (non-ST elevated myocardial infarction) (HCC) 08/02/2016   Chest pain 08/01/2016   Accelerated hypertension 08/01/2016   GERD (gastroesophageal reflux disease) 08/01/2016   AKI (acute kidney injury) (HCC) 08/01/2016    Oletta Cohn OTR/L,CLT 02/04/2021, 12:57 PM  [facility] [address], [place], [address] Phone: [phone]   Fax:  [phone]  Name: Adrian Blevins MRN: [id number] Date of Birth: [birth year]

## 2021-02-11 ENCOUNTER — Other Ambulatory Visit: Payer: Self-pay

## 2021-02-11 ENCOUNTER — Ambulatory Visit: Payer: Medicare HMO | Admitting: Occupational Therapy

## 2021-02-11 DIAGNOSIS — M25641 Stiffness of right hand, not elsewhere classified: Secondary | ICD-10-CM

## 2021-02-11 DIAGNOSIS — R208 Other disturbances of skin sensation: Secondary | ICD-10-CM | POA: Diagnosis not present

## 2021-02-11 DIAGNOSIS — M65331 Trigger finger, right middle finger: Secondary | ICD-10-CM | POA: Diagnosis not present

## 2021-02-11 DIAGNOSIS — M25631 Stiffness of right wrist, not elsewhere classified: Secondary | ICD-10-CM | POA: Diagnosis not present

## 2021-02-11 DIAGNOSIS — M6281 Muscle weakness (generalized): Secondary | ICD-10-CM | POA: Diagnosis not present

## 2021-02-11 DIAGNOSIS — L905 Scar conditions and fibrosis of skin: Secondary | ICD-10-CM | POA: Diagnosis not present

## 2021-02-11 NOTE — Therapy (Signed)
[facility] [address] [place], [address] Phone: [phone]   Fax:  [phone]  Occupational Therapy Treatment  Patient Details  Name: OSUALDO HANSELL MRN: [id number] Date of Birth: [birth year] Referring Provider (OT): Floyce Stakes   Encounter Date: 02/11/2021   OT End of Session - 02/11/21 0818     Visit Number 2    Number of Visits 6    Date for OT Re-Evaluation 03/18/21    OT Start Time 0800    OT Stop Time 0830    OT Time Calculation (min) 30 min    Activity Tolerance Patient tolerated treatment well    Behavior During Therapy [facility] for tasks assessed/performed             Past Medical History:  Diagnosis Date   GERD (gastroesophageal reflux disease)    Hypertension     Past Surgical History:  Procedure Laterality Date   NEPHRECTOMY Left     There were no vitals filed for this visit.   Subjective Assessment - 02/11/21 0816     Subjective  I don't really have pain more stiffness- using hand more , trying too - bathing , driving , computer - but the middle finger stays stiff - appt wiht Dr today for knot on my middle finger that moves around    Pertinent History Pt had CTR on 01/07/21 with suture removal on 01/21/21 and refer to OT -per pt had gout about 2 wks ago and was on steriod for week    Patient Stated Goals Want the numbness and motion in my hand to get better to grip and use my R hand    Currently in Pain? No/denies                [facility] OT Assessment - 02/11/21 0001       AROM   Right Wrist Extension 55 Degrees    Right Wrist Flexion 70 Degrees    Left Wrist Extension 70 Degrees    Left Wrist Flexion 80 Degrees      Right Hand AROM   R Thumb MCP 0-60 60 Degrees    R Thumb IP 0-80 20 Degrees    R Thumb Radial ABduction/ADduction 0-55 45    R Thumb Palmar ABduction/ADduction 0-45 40    R Index  MCP 0-90 75 Degrees    R Index PIP 0-100 80 Degrees    R Long  MCP 0-90 85  Degrees    R Long PIP 0-100 50 Degrees    R Ring  MCP 0-90 90 Degrees    R Ring PIP 0-100 80 Degrees    R Little  MCP 0-90 90 Degrees    R Little PIP 0-100 90 Degrees             Great progress in edema, pain , AROM in thumb , wrist and digits- except 3rd PIP -  Pt report having appt with surgeon about xray for 3rd digit - has knot that moves around          OT Treatments/Exercises (OP) - 02/11/21 0001       RUE Paraffin   Number Minutes Paraffin 8 Minutes    RUE Paraffin Location Wrist    Comments prior to soft tissue and ROM              Pt to change HEP to 2 x day - use moist heat prior to ROM  Isotoner glove  night time and silicon sleeve for day time on 3rd digit Scar massage to cont with and cica scar pad provided this date to use night time    Blocked tendon glides- full fist to narrow green block  now , then finger and then palm 10 reps  AAROM for thumb PA and RA  Composite flexion of thumb PROM - add and focus on for thumb IP  Opposition pick up 1 cm foam block - alternate digits  10 reps PROM for wrist ext and flexion - 10reps Hold 3-5 sec       OT Education - 02/11/21 0818     Education Details progress and HEP    Person(s) Educated Patient    Methods Explanation;Demonstration;Tactile cues;Verbal cues;Handout    Comprehension Verbal cues required;Returned demonstration;Verbalized understanding              OT Short Term Goals - 02/04/21 1249       OT SHORT TERM GOAL #1   Title Pt to be ind in HEP to decrease edema and numbness and increase AROM in R digits and wrist    Baseline edema , numbness in volar palm into thumb thru 3rd , and radial side of 4th - Tinel at base of palm, wrist et 35, flexion 60 - decrease flexion of 2nd and 3rd more than 4th an 5th - PIP 50 -80    Time 3    Period Weeks    Status New    Target Date 02/25/21               OT Long Term Goals - 02/04/21 1251       OT LONG TERM GOAL #1   Title R hand  digits flexion increase for pt to touch palm to grip utencils and change gears and pool stick    Baseline Flexion decrease MC's 70-90 and PIP's 50-80 - digits decreae in all planes - PA and RA 44, IP 20, MC 50    Time 5    Period Weeks    Status New    Target Date 03/11/21      OT LONG TERM GOAL #2   Title R grip strength and prehension strength more than 50% compare to L hand  to squeeze washcloth, hold steeringwheel , use mouse on computer    Baseline use L hand for changing gears, mouse on computer , cannot grip object    Time 6    Period Weeks    Status New    Target Date 03/18/21      OT LONG TERM GOAL #3   Title Sensation in palm and digits improve to at least 3.61 on semmes weinstein to report incrase use of R hand on computer    Baseline use mostly L hand on computer and mouse per pt- and Semmes wienstein to be assess next viit-s palm and thumb thru radial side of 4th numbness    Time 6    Period Weeks    Status New    Target Date 03/18/21                   Plan - 02/11/21 0819     Clinical Impression Statement Pt  s/p R CTR on 01/07/21 - pt now 5 wks s/p  - pt report he has hx of gout and had episode about 3 wks ago -  but pt showed since last week great progress in thumb AROM , edema, increase AROM in digits and  wrist - and pain reported - cont to have most of stiffness in 2nd and 3rd digits- pt report  his having appt with  MD today for xray - Pt cont to have  stiffness and numbness- pt to cont with scar massage and cica scar pad for night time added today - Numbness report in palm from [facility] to 2nd and 3rd - and DIP of thumb - decrease strength - all limiting his functional use of R dominant hand in ADL's and IADL's    OT Occupational Profile and History Problem Focused Assessment - Including review of records relating to presenting problem    Occupational performance deficits (Please refer to evaluation for details): ADL's;IADL's;Work;Play;Leisure;Social Participation     Body Structure / Function / Physical Skills ADL;Strength;Pain;Dexterity;Edema;UE functional use;ROM;Scar mobility;Sensation;FMC;Coordination;Flexibility    Rehab Potential Fair    Clinical Decision Making Limited treatment options, no task modification necessary    Comorbidities Affecting Occupational Performance: None    Modification or Assistance to Complete Evaluation  No modification of tasks or assist necessary to complete eval    OT Frequency 1x / week    OT Duration 6 weeks    OT Treatment/Interventions Self-care/ADL training;Fluidtherapy;DME and/or AE instruction;Contrast Bath;Therapeutic exercise;Scar mobilization;Passive range of motion;Paraffin;Manual Therapy;Patient/family education    Consulted and Agree with Plan of Care Patient             Patient will benefit from skilled therapeutic intervention in order to improve the following deficits and impairments:   Body Structure / Function / Physical Skills: ADL, Strength, Pain, Dexterity, Edema, UE functional use, ROM, Scar mobility, Sensation, FMC, Coordination, Flexibility       Visit Diagnosis: Stiffness of right hand, not elsewhere classified  Stiffness of right wrist, not elsewhere classified  Scar condition and fibrosis of skin  Other disturbances of skin sensation  Muscle weakness (generalized)    Problem List Patient Active Problem List   Diagnosis Date Noted   NSTEMI (non-ST elevated myocardial infarction) (HCC) 08/02/2016   Chest pain 08/01/2016   Accelerated hypertension 08/01/2016   GERD (gastroesophageal reflux disease) 08/01/2016   AKI (acute kidney injury) (HCC) 08/01/2016    Mihir Flanigan, Marisue Humble, OTR/L, CLT 02/11/2021, 12:00 PM  [place] [facility] [address] [place], [address] Phone: [phone]   Fax:  [phone]  Name: BRICESON BROADWATER MRN: [id number] Date of Birth: 1945/11/05

## 2021-02-25 ENCOUNTER — Ambulatory Visit: Payer: Medicare HMO | Admitting: Occupational Therapy

## 2021-02-25 ENCOUNTER — Other Ambulatory Visit: Payer: Self-pay

## 2021-02-25 DIAGNOSIS — M25631 Stiffness of right wrist, not elsewhere classified: Secondary | ICD-10-CM

## 2021-02-25 DIAGNOSIS — R208 Other disturbances of skin sensation: Secondary | ICD-10-CM | POA: Diagnosis not present

## 2021-02-25 DIAGNOSIS — M25641 Stiffness of right hand, not elsewhere classified: Secondary | ICD-10-CM

## 2021-02-25 DIAGNOSIS — M79644 Pain in right finger(s): Secondary | ICD-10-CM | POA: Diagnosis not present

## 2021-02-25 DIAGNOSIS — L905 Scar conditions and fibrosis of skin: Secondary | ICD-10-CM

## 2021-02-25 DIAGNOSIS — M6281 Muscle weakness (generalized): Secondary | ICD-10-CM | POA: Diagnosis not present

## 2021-02-25 NOTE — Therapy (Signed)
[facility] [address] [place], [address] Phone: [phone]   Fax:  [phone]  Occupational Therapy Treatment  Patient Details  Name: Adrian Blevins MRN: [id number] Date of Birth: 1946-05-30 Referring Provider (OT): Floyce Stakes   Encounter Date: 02/25/2021   OT End of Session - 02/25/21 0856     Visit Number 3    Number of Visits 6    Date for OT Re-Evaluation 03/18/21    OT Start Time 0830    OT Stop Time 0915    OT Time Calculation (min) 45 min    Activity Tolerance Patient tolerated treatment well    Behavior During Therapy [facility] for tasks assessed/performed             Past Medical History:  Diagnosis Date   GERD (gastroesophageal reflux disease)    Hypertension     Past Surgical History:  Procedure Laterality Date   NEPHRECTOMY Left     There were no vitals filed for this visit.   Subjective Assessment - 02/25/21 0838     Subjective  Seeing doctor again today- cont to to have more stiffness than pain in the middle and index finger- still have that knot moving around in the middle finger - numbness same and burning pain over that numb area - able to use hand on computer and bath and dress - but middle and index finger not better    Pertinent History Pt had CTR on 01/07/21 with suture removal on 01/21/21 and refer to OT -per pt had gout about 2 wks ago and was on steriod for week    Patient Stated Goals Want the numbness and motion in my hand to get better to grip and use my R hand    Currently in Pain? Yes    Pain Score 8     Pain Location Hand    Pain Orientation Right    Pain Descriptors / Indicators Tender;Tightness   Tender over 3rd A1pulley               [facility] OT Assessment - 02/25/21 0001       AROM   Right Wrist Extension 60 Degrees    Right Wrist Flexion 70 Degrees    Left Wrist Extension 70 Degrees    Left Wrist Flexion 80 Degrees      Strength   Right Hand Grip  (lbs) 20    Right Hand Lateral Pinch 6 lbs    Right Hand 3 Point Pinch 3 lbs    Left Hand Grip (lbs) 41    Left Hand Lateral Pinch 16 lbs    Left Hand 3 Point Pinch 12 lbs      Right Hand AROM   R Thumb MCP 0-60 60 Degrees    R Thumb IP 0-80 15 Degrees   After PROM - block - no IP when composite   R Thumb Radial ABduction/ADduction 0-55 48    R Thumb Palmar ABduction/ADduction 0-45 40    R Index  MCP 0-90 80 Degrees    R Index PIP 0-100 62 Degrees    R Long  MCP 0-90 90 Degrees    R Long PIP 0-100 65 Degrees    R Ring  MCP 0-90 90 Degrees    R Ring PIP 0-100 85 Degrees    R Little  MCP 0-90 90 Degrees    R Little PIP 0-100 85 Degrees  OT Treatments/Exercises (OP) - 02/25/21 0001       RUE Paraffin   Number Minutes Paraffin 8 Minutes    RUE Paraffin Location Wrist    Comments prior to soft tissue mobs and wrist flexion, ext stretch 2 min x 2               Pt to change HEP to 2 x day - use moist heat prior to ROM  Isotoner glove night time and silicon sleeve for day time on 3rd digit Cont soft tissue done to 2nd and 3rd PIP's , lateral bands and light traction - MC spreads and webspace   Pt show decrease PIP flexion of 2nd and thumb IP - numbness cont  Pt to do PROM to 2nd and 3rd PIP , then intrinsic stretch  PROM for composite flexion of 2nd and 3rd now that edema is better  Prior to place and hold composite fist  tendon glides- full fist to narrow green block  now , then finger and then palm 10 reps  AAROM for thumb PA and RA ( PA impaired- atrophy from CT ) Composite flexion of thumb PROM - add and focus on for thumb IP  Opposition pick up 1 cm foam block - alternate digits  10 reps PROM for wrist ext and flexion - 10reps Hold 3-5 sec        OT Education - 02/25/21 0856     Education Details progress and HEP    Person(s) Educated Patient    Methods Explanation;Demonstration;Tactile cues;Verbal cues;Handout     Comprehension Verbal cues required;Returned demonstration;Verbalized understanding              OT Short Term Goals - 02/04/21 1249       OT SHORT TERM GOAL #1   Title Pt to be ind in HEP to decrease edema and numbness and increase AROM in R digits and wrist    Baseline edema , numbness in volar palm into thumb thru 3rd , and radial side of 4th - Tinel at base of palm, wrist et 35, flexion 60 - decrease flexion of 2nd and 3rd more than 4th an 5th - PIP 50 -80    Time 3    Period Weeks    Status New    Target Date 02/25/21               OT Long Term Goals - 02/04/21 1251       OT LONG TERM GOAL #1   Title R hand digits flexion increase for pt to touch palm to grip utencils and change gears and pool stick    Baseline Flexion decrease MC's 70-90 and PIP's 50-80 - digits decreae in all planes - PA and RA 44, IP 20, MC 50    Time 5    Period Weeks    Status New    Target Date 03/11/21      OT LONG TERM GOAL #2   Title R grip strength and prehension strength more than 50% compare to L hand  to squeeze washcloth, hold steeringwheel , use mouse on computer    Baseline use L hand for changing gears, mouse on computer , cannot grip object    Time 6    Period Weeks    Status New    Target Date 03/18/21      OT LONG TERM GOAL #3   Title Sensation in palm and digits improve to at least 3.61 on semmes weinstein to report  incrase use of R hand on computer    Baseline use mostly L hand on computer and mouse per pt- and Semmes wienstein to be assess next viit-s palm and thumb thru radial side of 4th numbness    Time 6    Period Weeks    Status New    Target Date 03/18/21                   Plan - 02/25/21 0857     Clinical Impression Statement Pt  s/p R CTR on 01/07/21 - pt now 7 wks s/p  - pt report he has hx of gout  and had trouble with that few wks ago - pt had shot 2 wks ago to 3rd digit A1pulley - Pt show this date increase MC flexion in all digits - and increase  PIP flexion at 3rd but decrease of 2nd and thumb IP  - pt do have still numbness in thumb thru 3rd- grip and prehension decrease -  pt to work on PROM to 2nd and 3rd PIP , intrinsic fist and composite prior to composite fist to decrease triggering or over use of MC flexion - tender this date at 3rd and thumb A1pulley  - Pt cont to have  stiffness and numbness - Numbness report in palm from [facility] to 2nd and 3rd - and DIP of thumb - decrease strength - all limiting his functional use of R dominant hand in ADL's and IADL's    OT Occupational Profile and History Problem Focused Assessment - Including review of records relating to presenting problem    Occupational performance deficits (Please refer to evaluation for details): ADL's;IADL's;Work;Play;Leisure;Social Participation    Body Structure / Function / Physical Skills ADL;Strength;Pain;Dexterity;Edema;UE functional use;ROM;Scar mobility;Sensation;FMC;Coordination;Flexibility    Rehab Potential Fair    Clinical Decision Making Limited treatment options, no task modification necessary    Comorbidities Affecting Occupational Performance: None    Modification or Assistance to Complete Evaluation  No modification of tasks or assist necessary to complete eval    OT Frequency 1x / week    OT Duration 6 weeks    OT Treatment/Interventions Self-care/ADL training;Fluidtherapy;DME and/or AE instruction;Contrast Bath;Therapeutic exercise;Scar mobilization;Passive range of motion;Paraffin;Manual Therapy;Patient/family education    Consulted and Agree with Plan of Care Patient             Patient will benefit from skilled therapeutic intervention in order to improve the following deficits and impairments:   Body Structure / Function / Physical Skills: ADL, Strength, Pain, Dexterity, Edema, UE functional use, ROM, Scar mobility, Sensation, FMC, Coordination, Flexibility       Visit Diagnosis: Stiffness of right wrist, not elsewhere classified  Scar  condition and fibrosis of skin  Other disturbances of skin sensation  Muscle weakness (generalized)  Stiffness of right hand, not elsewhere classified    Problem List Patient Active Problem List   Diagnosis Date Noted   NSTEMI (non-ST elevated myocardial infarction) (HCC) 08/02/2016   Chest pain 08/01/2016   Accelerated hypertension 08/01/2016   GERD (gastroesophageal reflux disease) 08/01/2016   AKI (acute kidney injury) (HCC) 08/01/2016    Oletta Cohn, OTR/L,CLT 02/25/2021, 12:06 PM  [facility] [address] [place], [address] Phone: [phone]   Fax:  [phone]  Name: Adrian Blevins MRN: [id number] Date of Birth: 05-23-1946

## 2021-03-04 ENCOUNTER — Ambulatory Visit: Payer: Medicare HMO | Admitting: Occupational Therapy

## 2021-03-04 ENCOUNTER — Other Ambulatory Visit: Payer: Self-pay

## 2021-03-04 DIAGNOSIS — R208 Other disturbances of skin sensation: Secondary | ICD-10-CM | POA: Diagnosis not present

## 2021-03-04 DIAGNOSIS — L905 Scar conditions and fibrosis of skin: Secondary | ICD-10-CM | POA: Diagnosis not present

## 2021-03-04 DIAGNOSIS — M6281 Muscle weakness (generalized): Secondary | ICD-10-CM | POA: Diagnosis not present

## 2021-03-04 DIAGNOSIS — M25641 Stiffness of right hand, not elsewhere classified: Secondary | ICD-10-CM

## 2021-03-04 DIAGNOSIS — M25631 Stiffness of right wrist, not elsewhere classified: Secondary | ICD-10-CM

## 2021-03-04 NOTE — Therapy (Signed)
[place] [address] [place], [address] Phone: [phone]   Fax:  [phone]  Occupational Therapy Treatment  Patient Details  Name: Adrian Blevins MRN: [id number] Date of Birth: [birth year] Referring Provider (OT): Floyce Stakes   Encounter Date: 03/04/2021   OT End of Session - 03/04/21 0955     Visit Number 4    Number of Visits 6    Date for OT Re-Evaluation 03/18/21    OT Start Time 0915    OT Stop Time 0951    OT Time Calculation (min) 36 min    Activity Tolerance Patient tolerated treatment well    Behavior During Therapy [facility] for tasks assessed/performed             Past Medical History:  Diagnosis Date   GERD (gastroesophageal reflux disease)    Hypertension     Past Surgical History:  Procedure Laterality Date   NEPHRECTOMY Left     There were no vitals filed for this visit.   Subjective Assessment - 03/04/21 0954     Subjective  I am on steriod again -just finished yesterday -and Dr Rosita Kea recommend paraffin bath for me - my middle finger joint is arthritis    Pertinent History Pt had CTR on 01/07/21 with suture removal on 01/21/21 and refer to OT -per pt had gout about 2 wks ago and was on steriod for week    Patient Stated Goals Want the numbness and motion in my hand to get better to grip and use my R hand    Currently in Pain? No/denies                [facility] OT Assessment - 03/04/21 0001       AROM   Right Wrist Extension 60 Degrees    Right Wrist Flexion 60 Degrees    Left Wrist Extension 70 Degrees    Left Wrist Flexion 80 Degrees      Strength   Right Hand Grip (lbs) 24    Right Hand Lateral Pinch 9 lbs    Right Hand 3 Point Pinch 3 lbs    Left Hand Grip (lbs) 41    Left Hand Lateral Pinch 16 lbs    Left Hand 3 Point Pinch 12 lbs      Right Hand AROM   R Index  MCP 0-90 85 Degrees    R Index PIP 0-100 70 Degrees    R Long  MCP 0-90 90 Degrees    R Long PIP  0-100 70 Degrees    R Ring  MCP 0-90 90 Degrees    R Ring PIP 0-100 90 Degrees    R Little PIP 0-100 90 Degrees             Pt's MC flexion increase to WNL - and PIP 's increase compare to last time- but most stiffness in 2nd and 3rd PIP' Pt compensate with MC flexion to over come PIP stiffness and ADD of thumb with gripping          OT Treatments/Exercises (OP) - 03/04/21 0001       RUE Paraffin   Number Minutes Paraffin 8 Minutes    RUE Paraffin Location Hand   wrist   Comments prior to sof tissue and ROM             Ed on paraffin units and use  Pt to change HEP to 2 x day - use  paraffin prior to ROM  Isotoner glove night time and silicon sleeve for day time on 3rd digit if needed Cont soft tissue done to 2nd and 3rd PIP's , lateral bands and light traction - MC spreads and webspace    Pt to do PROM to 2nd and 3rd PIP , then intrinsic stretch  PROM for composite flexion of 2nd and 3rd now that edema is better  Prior to place and hold composite fist  - and pen in palm to block [facility] 's at 90  tendon glides- full fist to narrow green block  now , then finger and then palm 10 reps  AAROM for thumb PA and RA ( PA impaired- atrophy from CT ) Composite flexion of thumb PROM - add and focus on for thumb IP  Opposition pick up 1 cm foam block - alternate digits  10 reps PROM for wrist ext and flexion - 10reps- did not do table slides and flexion stretch  Hold 3-5 sec            OT Short Term Goals - 02/04/21 1249       OT SHORT TERM GOAL #1   Title Pt to be ind in HEP to decrease edema and numbness and increase AROM in R digits and wrist    Baseline edema , numbness in volar palm into thumb thru 3rd , and radial side of 4th - Tinel at base of palm, wrist et 35, flexion 60 - decrease flexion of 2nd and 3rd more than 4th an 5th - PIP 50 -80    Time 3    Period Weeks    Status New    Target Date 02/25/21               OT Long Term Goals - 02/04/21  1251       OT LONG TERM GOAL #1   Title R hand digits flexion increase for pt to touch palm to grip utencils and change gears and pool stick    Baseline Flexion decrease MC's 70-90 and PIP's 50-80 - digits decreae in all planes - PA and RA 44, IP 20, MC 50    Time 5    Period Weeks    Status New    Target Date 03/11/21      OT LONG TERM GOAL #2   Title R grip strength and prehension strength more than 50% compare to L hand  to squeeze washcloth, hold steeringwheel , use mouse on computer    Baseline use L hand for changing gears, mouse on computer , cannot grip object    Time 6    Period Weeks    Status New    Target Date 03/18/21      OT LONG TERM GOAL #3   Title Sensation in palm and digits improve to at least 3.61 on semmes weinstein to report incrase use of R hand on computer    Baseline use mostly L hand on computer and mouse per pt- and Semmes wienstein to be assess next viit-s palm and thumb thru radial side of 4th numbness    Time 6    Period Weeks    Status New    Target Date 03/18/21                   Plan - 03/04/21 0958     Clinical Impression Statement Pt  s/p R CTR on 01/07/21 - pt now 8 wks s/p  - pt report he  has hx of gout  and had trouble with that few wks ago - pt had shot 3 wks ago to 3rd digit A1pulley - Pt show this date increase MC flexion in all digits again WNL  - and increase PIP flexion at 2nd and 3rd but still decrease - cont to have more numbness in 3rdand 2nd than thumb- and lat and grip increase but no 3 point - Sensation and Tinel improving and ed pt on nerve healing -  pt to focus on PROM to 2nd and 3rd PIP , intrinsic fist and composite prior to composite fist  AROM to decrease triggering or over use of MC flexion/ and thumb CMC ADD   - Pt cont to have  stiffness and numbness - Numbness report in palm from [facility] to 2nd and 3rd - and DIP of thumb - decrease strength - all limiting his functional use of R dominant hand in ADL's and IADL's    OT  Occupational Profile and History Problem Focused Assessment - Including review of records relating to presenting problem    Occupational performance deficits (Please refer to evaluation for details): ADL's;IADL's;Work;Play;Leisure;Social Participation    Body Structure / Function / Physical Skills ADL;Strength;Pain;Dexterity;Edema;UE functional use;ROM;Scar mobility;Sensation;FMC;Coordination;Flexibility    Rehab Potential Fair    Clinical Decision Making Limited treatment options, no task modification necessary    Comorbidities Affecting Occupational Performance: None    Modification or Assistance to Complete Evaluation  No modification of tasks or assist necessary to complete eval    OT Frequency 1x / week    OT Duration 6 weeks    OT Treatment/Interventions Self-care/ADL training;Fluidtherapy;DME and/or AE instruction;Contrast Bath;Therapeutic exercise;Scar mobilization;Passive range of motion;Paraffin;Manual Therapy;Patient/family education    Consulted and Agree with Plan of Care Patient             Patient will benefit from skilled therapeutic intervention in order to improve the following deficits and impairments:   Body Structure / Function / Physical Skills: ADL, Strength, Pain, Dexterity, Edema, UE functional use, ROM, Scar mobility, Sensation, FMC, Coordination, Flexibility       Visit Diagnosis: Stiffness of right wrist, not elsewhere classified  Scar condition and fibrosis of skin  Other disturbances of skin sensation  Muscle weakness (generalized)  Stiffness of right hand, not elsewhere classified    Problem List Patient Active Problem List   Diagnosis Date Noted   NSTEMI (non-ST elevated myocardial infarction) (HCC) 08/02/2016   Chest pain 08/01/2016   Accelerated hypertension 08/01/2016   GERD (gastroesophageal reflux disease) 08/01/2016   AKI (acute kidney injury) (HCC) 08/01/2016    Oletta Cohn, OTR/L,CLT 03/04/2021, 10:03 AM  [facility] [address] [place], [address] Phone: [phone]   Fax:  [phone]  Name: Adrian Blevins MRN: [id number] Date of Birth: [birth year]

## 2021-03-11 ENCOUNTER — Ambulatory Visit: Payer: Medicare HMO | Admitting: Occupational Therapy

## 2021-03-18 DIAGNOSIS — Z1211 Encounter for screening for malignant neoplasm of colon: Secondary | ICD-10-CM | POA: Diagnosis not present

## 2021-03-18 DIAGNOSIS — N1832 Chronic kidney disease, stage 3b: Secondary | ICD-10-CM | POA: Diagnosis not present

## 2021-03-18 DIAGNOSIS — N189 Chronic kidney disease, unspecified: Secondary | ICD-10-CM | POA: Diagnosis not present

## 2021-03-18 DIAGNOSIS — M109 Gout, unspecified: Secondary | ICD-10-CM | POA: Diagnosis not present

## 2021-03-18 DIAGNOSIS — I129 Hypertensive chronic kidney disease with stage 1 through stage 4 chronic kidney disease, or unspecified chronic kidney disease: Secondary | ICD-10-CM | POA: Diagnosis not present

## 2021-03-18 DIAGNOSIS — I10 Essential (primary) hypertension: Secondary | ICD-10-CM | POA: Diagnosis not present

## 2021-03-18 DIAGNOSIS — Z1212 Encounter for screening for malignant neoplasm of rectum: Secondary | ICD-10-CM | POA: Diagnosis not present

## 2021-03-25 ENCOUNTER — Ambulatory Visit: Payer: Medicare HMO | Attending: Orthopedic Surgery | Admitting: Occupational Therapy

## 2021-03-25 ENCOUNTER — Other Ambulatory Visit: Payer: Self-pay

## 2021-03-25 DIAGNOSIS — R208 Other disturbances of skin sensation: Secondary | ICD-10-CM | POA: Insufficient documentation

## 2021-03-25 DIAGNOSIS — M6281 Muscle weakness (generalized): Secondary | ICD-10-CM | POA: Diagnosis not present

## 2021-03-25 DIAGNOSIS — L905 Scar conditions and fibrosis of skin: Secondary | ICD-10-CM | POA: Diagnosis not present

## 2021-03-25 DIAGNOSIS — M25631 Stiffness of right wrist, not elsewhere classified: Secondary | ICD-10-CM | POA: Insufficient documentation

## 2021-03-25 DIAGNOSIS — M25641 Stiffness of right hand, not elsewhere classified: Secondary | ICD-10-CM | POA: Diagnosis not present

## 2021-03-25 NOTE — Therapy (Signed)
[place] [facility] [address] [place], [address] Phone: [phone]   Fax:  [phone]  Occupational Therapy Treatment  Patient Details  Name: Adrian Blevins MRN: [id number] Date of Birth: 04/28/1946 Referring Provider (OT): Floyce Stakes   Encounter Date: 03/25/2021   OT End of Session - 03/25/21 0927     Visit Number 5    Number of Visits 8    Date for OT Re-Evaluation 05/06/21    OT Start Time 0835    OT Stop Time 0922    OT Time Calculation (min) 47 min    Activity Tolerance Patient tolerated treatment well    Behavior During Therapy [facility] for tasks assessed/performed             Past Medical History:  Diagnosis Date   GERD (gastroesophageal reflux disease)    Hypertension     Past Surgical History:  Procedure Laterality Date   NEPHRECTOMY Left     There were no vitals filed for this visit.   Subjective Assessment - 03/25/21 0925     Subjective  I am using my paraffin - how hot is yours- feeling little more pins and needles in the fingers but still some pain in thumb and back of index and middle finger with making fist    Pertinent History Pt had CTR on 01/07/21 with suture removal on 01/21/21 and refer to OT -per pt had gout about 2 wks ago and was on steriod for week    Patient Stated Goals Want the numbness and motion in my hand to get better to grip and use my R hand    Currently in Pain? Yes    Pain Score 2     Pain Location Hand    Pain Orientation Right    Pain Descriptors / Indicators Tender;Tightness    Pain Type Acute pain                [facility] OT Assessment - 03/25/21 0001       Strength   Right Hand Grip (lbs) 25    Right Hand Lateral Pinch 10 lbs    Right Hand 3 Point Pinch 3 lbs    Left Hand Grip (lbs) 45    Left Hand Lateral Pinch 16 lbs    Left Hand 3 Point Pinch 11 lbs             MC's flexion 90 , PIP 90 4th and 5th - touching palm  4th PIP 60- 3rd 65 PIP   After paraffin PIP 2nd 75 and 3rd 70 - PT TO DO PARAFFIN AND THEN PROM          OT Treatments/Exercises (OP) - 03/25/21 0001       RUE Paraffin   Number Minutes Paraffin 8 Minutes    RUE Paraffin Location Hand    Comments prior to soft tissue                  Ed on paraffin units and use again Pt to change HEP to 2 x day - use  paraffin prior to ROM the days at home - 2 x day Isotoner glove night time and silicon sleeve for day time on 3rd digit if needed Cont soft tissue done to 2nd and 3rd PIP's , lateral bands and light traction - MC spreads and webspace    Pt to do PROM to 2nd and 3rd PIP , then intrinsic stretch  PROM  for composite flexion of 2nd and 3rd now that edema is better  Prior to place and hold composite fist  -    AAROM for thumb PA and RA ( PA impaired- atrophy from CT ) add rubber band for resistance for PA and RA - PLACE and HOLD 15 reps  Composite flexion of thumb PROM - add and focus on for thumb IP  Opposition pick up 1 cm foam block - alternate digits  10 reps Add light blue putty for grip  , lat and 3 point pinch - 15 reps pain free- AFTER ROM  1 x day then increase to 2 x day  And then gradually increase to 2nd set 1 and then 2 x day - pain free       OT Education - 03/25/21 0927     Education Details progress and HEP    Person(s) Educated Patient    Methods Explanation;Demonstration;Tactile cues;Verbal cues;Handout    Comprehension Verbal cues required;Returned demonstration;Verbalized understanding              OT Short Term Goals - 03/25/21 0934       OT SHORT TERM GOAL #1   Title Pt to be ind in HEP to decrease edema and numbness and increase AROM in R digits and wrist    Baseline edema , numbness in volar palm into thumb thru 3rd , and radial side of 4th - Tinel at base of palm, wrist et 35, flexion 60 - decrease flexion of 2nd and 3rd more than 4th an 5th - PIP 50 -80 NOW progress in MC flexion WNL , 2nd and 3rd PIP  decrease still - Tinel improve to distal to [facility] -  progressing    Time 4    Period Weeks    Status On-going    Target Date 04/22/21               OT Long Term Goals - 03/25/21 0936       OT LONG TERM GOAL #1   Title R hand digits flexion increase for pt to touch palm to grip utencils and change gears and pool stick    Baseline Flexion decrease MC's 70-90 and PIP's 50-80 - digits decreae in all planes - PA and RA 44, IP 20, MC 50  NOW MC flexion now 90's , PIP 2dn and 3rd 60 -65 - insession 70-75 , hard time still gripping utencils because of 2nd and 3rd stiffness and numbness    Time 6    Period Weeks    Status On-going    Target Date 05/06/21      OT LONG TERM GOAL #2   Title R grip strength and prehension strength more than 50% compare to L hand  to squeeze washcloth, hold steeringwheel , use mouse on computer    Baseline use L hand for changing gears, mouse on computer , cannot grip object  NOW - just initiated  light easy putty today  because he had trigger finger shot to 3rd and on steriod for gout flareup - cont numbness in thumb thru 3rd - but improving Tinel -  grip and prehension same- decrease greatly grip R 25, L 45 lbs; lat 9 , 3 poin 3 lbs on the R , L 19 and 11lbs    Time 6    Period Weeks    Status On-going    Target Date 05/06/21      OT LONG TERM GOAL #3   Title Sensation  in palm and digits improve to at least 3.61 on semmes weinstein to report incrase use of R hand on computer    Baseline use mostly L hand on computer and mouse per pt- and Semmes wienstein to be assess next viit-s palm and thumb thru radial side of 4th numbness  NOW Tinel improving - will assess next visiti    Time 6    Period Weeks    Status On-going    Target Date 05/06/21                   Plan - 03/25/21 2119     Clinical Impression Statement Pt  s/p R CTR on 01/07/21 - pt now 11 wks s/p  - pt report he has hx of gout  and had trouble with it since surgery but oing better -  he  had shot few wks ago in 3rd digit A1pulley - Pt show this date  MC flexion in all digits  WNL  - but PIP flexion of 2nd and 3rd decrease - and he has paraffin at home to use and nerver is progressing - but still numbness in thumb rhur 3rd- reinforce again for pt PROM after paraffin for PIP and composite - to keep flexibility in 2nd and 3rd until sensation comes in. Sensation and Tinel improving and ed pt on nerve healing -  Add putty light for grip, lat and 3 point this date and rubber band for place and hold for thumb PA and RA - review and pt ed on how to advance his HEP - pt work out of town and only in town on Fridays.  Pt cont to have  stiffness and numbness mostly in 2nd and 3rd - as well as thumb  - Numbness report  distal to [facility] to 2nd and 3rd - and DIP of thumb - decrease strength - all limiting his functional use of R dominant hand in ADL's and IADL's    OT Occupational Profile and History Problem Focused Assessment - Including review of records relating to presenting problem    Occupational performance deficits (Please refer to evaluation for details): ADL's;IADL's;Work;Play;Leisure;Social Participation    Body Structure / Function / Physical Skills ADL;Strength;Pain;Dexterity;Edema;UE functional use;ROM;Scar mobility;Sensation;FMC;Coordination;Flexibility    Rehab Potential Fair    Clinical Decision Making Limited treatment options, no task modification necessary    Comorbidities Affecting Occupational Performance: None    Modification or Assistance to Complete Evaluation  No modification of tasks or assist necessary to complete eval    OT Frequency Biweekly    OT Duration 6 weeks    OT Treatment/Interventions Self-care/ADL training;Fluidtherapy;DME and/or AE instruction;Contrast Bath;Therapeutic exercise;Scar mobilization;Passive range of motion;Paraffin;Manual Therapy;Patient/family education    Consulted and Agree with Plan of Care Patient             Patient will benefit from  skilled therapeutic intervention in order to improve the following deficits and impairments:   Body Structure / Function / Physical Skills: ADL, Strength, Pain, Dexterity, Edema, UE functional use, ROM, Scar mobility, Sensation, FMC, Coordination, Flexibility       Visit Diagnosis: Stiffness of right wrist, not elsewhere classified  Scar condition and fibrosis of skin  Other disturbances of skin sensation  Muscle weakness (generalized)  Stiffness of right hand, not elsewhere classified    Problem List Patient Active Problem List   Diagnosis Date Noted   NSTEMI (non-ST elevated myocardial infarction) (HCC) 08/02/2016   Chest pain 08/01/2016   Accelerated hypertension 08/01/2016  GERD (gastroesophageal reflux disease) 08/01/2016   AKI (acute kidney injury) (HCC) 08/01/2016    Oletta Cohn, OTR/L,CLT 03/25/2021, 9:44 AM  [facility] [address], [place], [address] Phone: [phone]   Fax:  [phone]  Name: Adrian Blevins MRN: [id number] Date of Birth: 05/11/1946

## 2021-04-08 ENCOUNTER — Other Ambulatory Visit: Payer: Self-pay

## 2021-04-08 ENCOUNTER — Ambulatory Visit: Payer: Medicare HMO | Attending: Orthopedic Surgery | Admitting: Occupational Therapy

## 2021-04-08 DIAGNOSIS — M6281 Muscle weakness (generalized): Secondary | ICD-10-CM | POA: Diagnosis not present

## 2021-04-08 DIAGNOSIS — M25631 Stiffness of right wrist, not elsewhere classified: Secondary | ICD-10-CM | POA: Insufficient documentation

## 2021-04-08 DIAGNOSIS — R208 Other disturbances of skin sensation: Secondary | ICD-10-CM | POA: Diagnosis not present

## 2021-04-08 DIAGNOSIS — L905 Scar conditions and fibrosis of skin: Secondary | ICD-10-CM | POA: Diagnosis not present

## 2021-04-08 DIAGNOSIS — M25641 Stiffness of right hand, not elsewhere classified: Secondary | ICD-10-CM | POA: Diagnosis not present

## 2021-04-08 NOTE — Therapy (Signed)
[facility] [address], [place], [address] Phone: [phone]   Fax:  [phone]  Occupational Therapy Treatment  Patient Details  Name: Adrian Blevins MRN: [id number] Date of Birth: [birth year] Referring Provider (OT): Floyce Stakes   Encounter Date: 04/08/2021   OT End of Session - 04/08/21 1141     Visit Number 6    Number of Visits 8    Date for OT Re-Evaluation 05/06/21    OT Start Time 0832    OT Stop Time 0914    OT Time Calculation (min) 42 min    Activity Tolerance Patient tolerated treatment well    Behavior During Therapy [facility] for tasks assessed/performed             Past Medical History:  Diagnosis Date   GERD (gastroesophageal reflux disease)    Hypertension     Past Surgical History:  Procedure Laterality Date   NEPHRECTOMY Left     There were no vitals filed for this visit.   Subjective Assessment - 04/08/21 0840     Subjective  More feeling in the fingers it feels like -paraffin still doing at least one time- during day some swelling but night time wearing glove    Pertinent History Pt had CTR on 01/07/21 with suture removal on 01/21/21 and refer to OT -per pt had gout about 2 wks ago and was on steriod for week    Patient Stated Goals Want the numbness and motion in my hand to get better to grip and use my R hand                [facility] OT Assessment - 04/08/21 0001       AROM   Right Wrist Extension 70 Degrees    Right Wrist Flexion 60 Degrees      Strength   Right Hand Grip (lbs) 32    Right Hand Lateral Pinch 10 lbs    Right Hand 3 Point Pinch 3 lbs    Left Hand Grip (lbs) 45    Left Hand Lateral Pinch 16 lbs    Left Hand 3 Point Pinch 11 lbs      Right Hand AROM   R Index  MCP 0-90 80 Degrees    R Index PIP 0-100 65 Degrees    R Long  MCP 0-90 85 Degrees    R Long PIP 0-100 65 Degrees    R Ring  MCP 0-90 90 Degrees    R Ring PIP 0-100 90 Degrees    R Little  PIP 0-100 90 Degrees    R Little DIP 0-70 85 Degrees                      OT Treatments/Exercises (OP) - 04/08/21 0001       RUE Paraffin   Number Minutes Paraffin 8 Minutes    RUE Paraffin Location Hand    Comments prior to soft tissue and ROM              Ed on paraffin units and use again Pt to change HEP to 2 x day days at home - use  paraffin prior to ROM the days at home - 2 x day Isotoner glove night time if needed Cont soft tissue done to 2nd and 3rd PIP's , lateral bands and light traction - MC spreads and webspace    Pt to do PROM to 2nd and 3rd  PIP , then intrinsic stretch  PROM for composite flexion of 2nd and 3rd now that edema is better  Prior to place and hold composite fist  -     AAROM for thumb PA and RA ( PA impaired- atrophy from CT ) add rubber band for resistance for PA and RA - PLACE and HOLD 15 reps  Composite flexion of thumb PROM - add and focus on for thumb IP  Opposition pick up 1 cm foam block - alternate digits  10 reps Upgrade to med teal putty for grip  , lat and 3 point pinch - 12 reps pain free- AFTER ROM  1 x day then increase to 2 x day  And then gradually increase to 2nd set 1  week and then 2 3rd set in 2 wks -  Cont HEP for month - Numbness in 2nd and 3rd limiting his progress and functional use -and thumb           OT Education - 04/08/21 1141     Education Details progress and HEP    Person(s) Educated Patient    Methods Explanation;Demonstration;Tactile cues;Verbal cues;Handout    Comprehension Verbal cues required;Returned demonstration;Verbalized understanding              OT Short Term Goals - 03/25/21 0934       OT SHORT TERM GOAL #1   Title Pt to be ind in HEP to decrease edema and numbness and increase AROM in R digits and wrist    Baseline edema , numbness in volar palm into thumb thru 3rd , and radial side of 4th - Tinel at base of palm, wrist et 35, flexion 60 - decrease flexion of 2nd and 3rd  more than 4th an 5th - PIP 50 -80 NOW progress in MC flexion WNL , 2nd and 3rd PIP decrease still - Tinel improve to distal to [facility] -  progressing    Time 4    Period Weeks    Status On-going    Target Date 04/22/21               OT Long Term Goals - 03/25/21 0936       OT LONG TERM GOAL #1   Title R hand digits flexion increase for pt to touch palm to grip utencils and change gears and pool stick    Baseline Flexion decrease MC's 70-90 and PIP's 50-80 - digits decreae in all planes - PA and RA 44, IP 20, MC 50  NOW MC flexion now 90's , PIP 2dn and 3rd 60 -65 - insession 70-75 , hard time still gripping utencils because of 2nd and 3rd stiffness and numbness    Time 6    Period Weeks    Status On-going    Target Date 05/06/21      OT LONG TERM GOAL #2   Title R grip strength and prehension strength more than 50% compare to L hand  to squeeze washcloth, hold steeringwheel , use mouse on computer    Baseline use L hand for changing gears, mouse on computer , cannot grip object  NOW - just initiated  light easy putty today  because he had trigger finger shot to 3rd and on steriod for gout flareup - cont numbness in thumb thru 3rd - but improving Tinel -  grip and prehension same- decrease greatly grip R 25, L 45 lbs; lat 9 , 3 poin 3 lbs on the R , L 19 and 11lbs  Time 6    Period Weeks    Status On-going    Target Date 05/06/21      OT LONG TERM GOAL #3   Title Sensation in palm and digits improve to at least 3.61 on semmes weinstein to report incrase use of R hand on computer    Baseline use mostly L hand on computer and mouse per pt- and Semmes wienstein to be assess next viit-s palm and thumb thru radial side of 4th numbness  NOW Tinel improving - will assess next visiti    Time 6    Period Weeks    Status On-going    Target Date 05/06/21                   Plan - 04/08/21 1142     Clinical Impression Statement Pt  s/p R CTR on 01/07/21 - pt now 3 months s/p  - pt  report he has hx of gout  and had trouble with it since surgery but doing better -  he had shot few wks ago in 3rd digit A1pulley - Pt show this date  MC flexion in all digits  WNL compare to the L  - but PIP flexion of 2nd and 3rd still decrease - and he has paraffin at home to use -  with PROM to digits while pt still have numbness in thumb thru 3rd- reinforce again for pt PROM after paraffin for PIP and composite flexion- to keep flexibility in 2nd and 3rd until sensation comes in. Sensation on Semmes Weistein 4.56 at DIP of 2nd and 3rd -and 4.31 on proximal 2nd phalanges, DIP of thumb and distal to [facility] of 3rd - 3.61 on 4th nad 5th and proximal to DIP of thumb - upgrade pt to teal putty for grip, lat and 3 point this date and cont  rubber band for place and hold for thumb PA and RA - review and pt ed on how to advance his HEP - pt work out of town and only in town on Fridays.  Pt cont to have  stiffness and numbness mostly in 2nd and 3rd- cont with stiffness and decrease strength- all limiting his functional use of R dominant hand in ADL's and IADL's    OT Occupational Profile and History Problem Focused Assessment - Including review of records relating to presenting problem    Occupational performance deficits (Please refer to evaluation for details): ADL's;IADL's;Work;Play;Leisure;Social Participation    Body Structure / Function / Physical Skills ADL;Strength;Pain;Dexterity;Edema;UE functional use;ROM;Scar mobility;Sensation;FMC;Coordination;Flexibility    Rehab Potential Fair    Clinical Decision Making Limited treatment options, no task modification necessary    Comorbidities Affecting Occupational Performance: None    Modification or Assistance to Complete Evaluation  No modification of tasks or assist necessary to complete eval    OT Frequency Monthly    OT Duration 6 weeks    OT Treatment/Interventions Self-care/ADL training;Fluidtherapy;DME and/or AE instruction;Contrast Bath;Therapeutic  exercise;Scar mobilization;Passive range of motion;Paraffin;Manual Therapy;Patient/family education    Consulted and Agree with Plan of Care Patient             Patient will benefit from skilled therapeutic intervention in order to improve the following deficits and impairments:   Body Structure / Function / Physical Skills: ADL, Strength, Pain, Dexterity, Edema, UE functional use, ROM, Scar mobility, Sensation, FMC, Coordination, Flexibility       Visit Diagnosis: Stiffness of right wrist, not elsewhere classified  Scar condition and fibrosis of skin  Other  disturbances of skin sensation  Muscle weakness (generalized)  Stiffness of right hand, not elsewhere classified    Problem List Patient Active Problem List   Diagnosis Date Noted   NSTEMI (non-ST elevated myocardial infarction) (HCC) 08/02/2016   Chest pain 08/01/2016   Accelerated hypertension 08/01/2016   GERD (gastroesophageal reflux disease) 08/01/2016   AKI (acute kidney injury) (HCC) 08/01/2016    Oletta Cohn, OTR/L,CLT 04/08/2021, 12:04 PM   [facility] [address] [place], [address] Phone: [phone]   Fax:  [phone]  Name: SAN LOHMEYER MRN: [id number] Date of Birth: 08-22-1945

## 2021-04-11 DIAGNOSIS — H524 Presbyopia: Secondary | ICD-10-CM | POA: Diagnosis not present

## 2021-04-11 DIAGNOSIS — H25813 Combined forms of age-related cataract, bilateral: Secondary | ICD-10-CM | POA: Diagnosis not present

## 2021-05-06 ENCOUNTER — Ambulatory Visit: Payer: Medicare HMO | Attending: Orthopedic Surgery | Admitting: Occupational Therapy

## 2021-05-06 ENCOUNTER — Other Ambulatory Visit: Payer: Self-pay

## 2021-05-06 DIAGNOSIS — L905 Scar conditions and fibrosis of skin: Secondary | ICD-10-CM | POA: Insufficient documentation

## 2021-05-06 DIAGNOSIS — R208 Other disturbances of skin sensation: Secondary | ICD-10-CM | POA: Diagnosis present

## 2021-05-06 DIAGNOSIS — M6281 Muscle weakness (generalized): Secondary | ICD-10-CM | POA: Diagnosis present

## 2021-05-06 DIAGNOSIS — M25631 Stiffness of right wrist, not elsewhere classified: Secondary | ICD-10-CM | POA: Diagnosis not present

## 2021-05-06 DIAGNOSIS — M25641 Stiffness of right hand, not elsewhere classified: Secondary | ICD-10-CM | POA: Diagnosis present

## 2021-05-06 NOTE — Therapy (Signed)
[place] [facility] [address] [place], [address] Phone: [phone]   Fax:  [phone]  Occupational Therapy Treatment  Patient Details  Name: Adrian Blevins MRN: [id number] Date of Birth: Nov 28, 1945 Referring Provider (OT): Floyce Stakes   Encounter Date: 05/06/2021   OT End of Session - 05/06/21 0929     Visit Number 7    Number of Visits 8    Date for OT Re-Evaluation 07/01/21    OT Start Time 0836    OT Stop Time 0915    OT Time Calculation (min) 39 min    Activity Tolerance Patient tolerated treatment well    Behavior During Therapy [facility] for tasks assessed/performed             Past Medical History:  Diagnosis Date   GERD (gastroesophageal reflux disease)    Hypertension     Past Surgical History:  Procedure Laterality Date   NEPHRECTOMY Left     There were no vitals filed for this visit.   Subjective Assessment - 05/06/21 0927     Subjective  Look I can make fist - touch my palm- sensation like you told me is better now in my fingers - I can feel the tips - writing, doing buttons and typing    Pertinent History Pt had CTR on 01/07/21 with suture removal on 01/21/21 and refer to OT -per pt had gout about 2 wks ago and was on steriod for week    Patient Stated Goals Want the numbness and motion in my hand to get better to grip and use my R hand    Currently in Pain? No/denies                [facility] OT Assessment - 05/06/21 0001       AROM   Right Wrist Extension 70 Degrees    Right Wrist Flexion 60 Degrees      Strength   Right Hand Grip (lbs) 41    Right Hand Lateral Pinch 11 lbs    Right Hand 3 Point Pinch 8 lbs    Left Hand Grip (lbs) 45    Left Hand Lateral Pinch 16 lbs    Left Hand 3 Point Pinch 11 lbs      Right Hand AROM   R Index  MCP 0-90 85 Degrees    R Index PIP 0-100 85 Degrees    R Long  MCP 0-90 85 Degrees    R Long PIP 0-100 85 Degrees    R Ring  MCP 0-90 90  Degrees    R Ring PIP 0-100 90 Degrees    R Little PIP 0-100 90 Degrees    R Little DIP 0-70 90 Degrees                  Pt report doing his HEP to 2 x day days  if working from home - use  paraffin prior to ROM the days at home - 2 x day Isotoner glove night time if needed    Pt  do PROM to 2nd and 3rd PIP , then intrinsic stretch  PROM for composite flexion of 2nd and 3rd now that edema is better  Can now do AROM touching palm - PIP increase from 65 to 85 at 2nd and 3rd since month ago     AAROM for thumb PA and RA ( PA impaired still - atrophy from CT ) cont rubber band for  resistance for PA and RA - PLACE and HOLD 15 reps  Composite flexion of thumb PROM - add and focus on for thumb IP  Opposition pick up 1 cm foam block - alternate digits  10 reps Upgrade to firm green for grip  , lat and 3 point pinch - 12 reps pain free- AFTER ROM   But pt to decrease reps and sets - and stop if increase pain on 3rd PIP  gradually increase to 2nd set 1  week and then  3rd set in 2 wks -  Cont HEP for 2 month -and can follow up if needed one more time Numbness in 2nd and 3rd DIP but improving - see Phoebe Perch in clinical impression                        OT Education - 05/06/21 0929     Education Details progress and HEP    Person(s) Educated Patient    Methods Explanation;Demonstration;Tactile cues;Verbal cues;Handout    Comprehension Verbal cues required;Returned demonstration;Verbalized understanding              OT Short Term Goals - 05/06/21 0939       OT SHORT TERM GOAL #1   Title Pt to be ind in HEP to decrease edema and numbness and increase AROM in R digits and wrist    Baseline edema , numbness in volar palm into thumb thru 3rd , and radial side of 4th - Tinel at base of palm, wrist et 35, flexion 60 - decrease flexion of 2nd and 3rd more than 4th an 5th - PIP 50 -80 NOWable to touch palm , no edema and senstaion improving - thumb normal  senstaion and 2nd and 3rd still 3.61 and 4.31    Status Achieved               OT Long Term Goals - 05/06/21 0940       OT LONG TERM GOAL #1   Title R hand digits flexion increase for pt to touch palm to grip utencils and change gears and pool stick    Baseline Flexion decrease MC's 70-90 and PIP's 50-80 - digits decreae in all planes - PA and RA 44, IP 20, MC 50  NOW able to touch palm and grip increased to 41 lbs    Status Achieved      OT LONG TERM GOAL #2   Title R grip strength and prehension strength more than 50% compare to L hand  to squeeze washcloth, hold steeringwheel , use mouse on computer    Baseline use L hand for changing gears, mouse on computer , cannot grip object  NOW - just initiated  light easy putty today  because he had trigger finger shot to 3rd and on steriod for gout flareup - cont numbness in thumb thru 3rd - but improving Tinel -  grip and prehension same- decrease greatly grip R 25, L 45 lbs; lat 9 , 3 poin 3 lbs on the R , L 19 and 11lbs NOW Grip increase to 41, lat 11 and 3 point 8 lbs    Status Achieved      OT LONG TERM GOAL #3   Title Sensation in palm and digits improve to at least 3.61 on semmes weinstein to report incrase use of R hand on computer    Baseline use mostly L hand on computer and mouse per pt- and Semmes wienstein to be assess  next viit-s palm and thumb thru radial side of 4th numbness  NOW Tinel improving - 4.31 on 3rd DIP , and 3.61 now in DIP of 2nd and middle [id number]  -thumb normal sensation    Time 8    Period Weeks    Status On-going    Target Date 07/01/21                   Plan - 05/06/21 0929     Clinical Impression Statement Pt  s/p R CTR on 01/07/21 - pt now about 4  months s/p  - pt report he has hx of gout  and had trouble with it since surgery but doing better - Pt return this date after doing his HEP for month - he got paraffin bath to use at home and show great progress now that sensation is improving -   increase flexion of 2nd and 3rd - able to touch palm - increase sensation in thumb thru 3rd - increase grip and prehension strength- and increase use of R hand with buttons, writing  and typing. Sensation on Semmes Weistein improved to normal sensation in thumb , 4th and 5th;  DIP of 3rd -and 4.31 ; and 3rd middle and 2nd DIP 3.61.- upgrade pt to firm green putty for grip, lat and 3 point this date and cont  rubber band for place and hold for thumb PA and RA - review and pt ed on how to advance his HEP for month or 2 - and can follow up with me again in 4-8 wks. Cont to have some numbness mostly in 2nd and 3rd but improving limiting his functional use of R dominant hand in ADL's and IADL's    OT Occupational Profile and History Problem Focused Assessment - Including review of records relating to presenting problem    Occupational performance deficits (Please refer to evaluation for details): ADL's;IADL's;Work;Play;Leisure;Social Participation    Body Structure / Function / Physical Skills ADL;Strength;Pain;Dexterity;Edema;UE functional use;ROM;Scar mobility;Sensation;FMC;Coordination;Flexibility    Rehab Potential Good    Clinical Decision Making Limited treatment options, no task modification necessary    Comorbidities Affecting Occupational Performance: None    Modification or Assistance to Complete Evaluation  No modification of tasks or assist necessary to complete eval    OT Frequency Monthly    OT Duration 8 weeks    OT Treatment/Interventions Self-care/ADL training;Fluidtherapy;DME and/or AE instruction;Contrast Bath;Therapeutic exercise;Scar mobilization;Passive range of motion;Paraffin;Manual Therapy;Patient/family education    Consulted and Agree with Plan of Care Patient             Patient will benefit from skilled therapeutic intervention in order to improve the following deficits and impairments:   Body Structure / Function / Physical Skills: ADL, Strength, Pain, Dexterity, Edema,  UE functional use, ROM, Scar mobility, Sensation, FMC, Coordination, Flexibility       Visit Diagnosis: Stiffness of right wrist, not elsewhere classified - Plan: Ot plan of care cert/re-cert  Scar condition and fibrosis of skin - Plan: Ot plan of care cert/re-cert  Other disturbances of skin sensation - Plan: Ot plan of care cert/re-cert  Muscle weakness (generalized) - Plan: Ot plan of care cert/re-cert  Stiffness of right hand, not elsewhere classified - Plan: Ot plan of care cert/re-cert    Problem List Patient Active Problem List   Diagnosis Date Noted   NSTEMI (non-ST elevated myocardial infarction) (HCC) 08/02/2016   Chest pain 08/01/2016   Accelerated hypertension 08/01/2016   GERD (gastroesophageal reflux disease) 08/01/2016  AKI (acute kidney injury) (HCC) 08/01/2016    Oletta Cohn, OTR/L,CLT 05/06/2021, 9:45 AM  [facility] [address], [place], [address] Phone: [phone]   Fax:  [phone]  Name: Adrian Blevins MRN: [id number] Date of Birth: 1946-02-27

## 2021-12-23 DIAGNOSIS — M109 Gout, unspecified: Secondary | ICD-10-CM | POA: Diagnosis not present

## 2021-12-23 DIAGNOSIS — I110 Hypertensive heart disease with heart failure: Secondary | ICD-10-CM | POA: Diagnosis not present

## 2021-12-23 DIAGNOSIS — R809 Proteinuria, unspecified: Secondary | ICD-10-CM | POA: Diagnosis not present

## 2021-12-23 DIAGNOSIS — I509 Heart failure, unspecified: Secondary | ICD-10-CM | POA: Diagnosis not present

## 2021-12-26 DIAGNOSIS — H25813 Combined forms of age-related cataract, bilateral: Secondary | ICD-10-CM | POA: Diagnosis not present

## 2022-01-06 DIAGNOSIS — I10 Essential (primary) hypertension: Secondary | ICD-10-CM | POA: Diagnosis not present

## 2022-01-06 DIAGNOSIS — H2511 Age-related nuclear cataract, right eye: Secondary | ICD-10-CM | POA: Diagnosis not present

## 2022-01-06 DIAGNOSIS — H2513 Age-related nuclear cataract, bilateral: Secondary | ICD-10-CM | POA: Diagnosis not present

## 2022-02-09 DIAGNOSIS — H2511 Age-related nuclear cataract, right eye: Secondary | ICD-10-CM | POA: Diagnosis not present

## 2022-02-10 DIAGNOSIS — H25042 Posterior subcapsular polar age-related cataract, left eye: Secondary | ICD-10-CM | POA: Diagnosis not present

## 2022-02-10 DIAGNOSIS — H2512 Age-related nuclear cataract, left eye: Secondary | ICD-10-CM | POA: Diagnosis not present

## 2022-02-10 DIAGNOSIS — H25012 Cortical age-related cataract, left eye: Secondary | ICD-10-CM | POA: Diagnosis not present

## 2022-02-23 DIAGNOSIS — H2511 Age-related nuclear cataract, right eye: Secondary | ICD-10-CM | POA: Diagnosis not present

## 2022-02-23 DIAGNOSIS — H2512 Age-related nuclear cataract, left eye: Secondary | ICD-10-CM | POA: Diagnosis not present

## 2022-03-23 DIAGNOSIS — Z961 Presence of intraocular lens: Secondary | ICD-10-CM | POA: Diagnosis not present

## 2022-04-21 DIAGNOSIS — N289 Disorder of kidney and ureter, unspecified: Secondary | ICD-10-CM | POA: Diagnosis not present

## 2022-04-21 DIAGNOSIS — M109 Gout, unspecified: Secondary | ICD-10-CM | POA: Diagnosis not present

## 2022-04-21 DIAGNOSIS — I509 Heart failure, unspecified: Secondary | ICD-10-CM | POA: Diagnosis not present

## 2022-04-21 DIAGNOSIS — I110 Hypertensive heart disease with heart failure: Secondary | ICD-10-CM | POA: Diagnosis not present

## 2022-06-29 DIAGNOSIS — H902 Conductive hearing loss, unspecified: Secondary | ICD-10-CM | POA: Diagnosis not present

## 2022-06-29 DIAGNOSIS — H6123 Impacted cerumen, bilateral: Secondary | ICD-10-CM | POA: Diagnosis not present

## 2022-07-28 DIAGNOSIS — Z0000 Encounter for general adult medical examination without abnormal findings: Secondary | ICD-10-CM | POA: Diagnosis not present

## 2022-07-28 DIAGNOSIS — I509 Heart failure, unspecified: Secondary | ICD-10-CM | POA: Diagnosis not present

## 2022-07-28 DIAGNOSIS — Z1331 Encounter for screening for depression: Secondary | ICD-10-CM | POA: Diagnosis not present

## 2022-07-28 DIAGNOSIS — N1832 Chronic kidney disease, stage 3b: Secondary | ICD-10-CM | POA: Diagnosis not present

## 2022-07-28 DIAGNOSIS — I110 Hypertensive heart disease with heart failure: Secondary | ICD-10-CM | POA: Diagnosis not present

## 2022-07-28 DIAGNOSIS — N289 Disorder of kidney and ureter, unspecified: Secondary | ICD-10-CM | POA: Diagnosis not present

## 2022-07-28 DIAGNOSIS — M109 Gout, unspecified: Secondary | ICD-10-CM | POA: Diagnosis not present

## 2022-07-28 DIAGNOSIS — I2510 Atherosclerotic heart disease of native coronary artery without angina pectoris: Secondary | ICD-10-CM | POA: Diagnosis not present

## 2022-08-28 DIAGNOSIS — N184 Chronic kidney disease, stage 4 (severe): Secondary | ICD-10-CM | POA: Diagnosis not present

## 2022-08-28 DIAGNOSIS — I129 Hypertensive chronic kidney disease with stage 1 through stage 4 chronic kidney disease, or unspecified chronic kidney disease: Secondary | ICD-10-CM | POA: Diagnosis not present

## 2022-08-28 DIAGNOSIS — R809 Proteinuria, unspecified: Secondary | ICD-10-CM | POA: Diagnosis not present

## 2022-08-28 DIAGNOSIS — N2581 Secondary hyperparathyroidism of renal origin: Secondary | ICD-10-CM | POA: Diagnosis not present

## 2022-08-28 DIAGNOSIS — R600 Localized edema: Secondary | ICD-10-CM | POA: Diagnosis not present

## 2022-08-28 DIAGNOSIS — I10 Essential (primary) hypertension: Secondary | ICD-10-CM | POA: Diagnosis not present

## 2022-08-29 ENCOUNTER — Other Ambulatory Visit: Payer: Self-pay

## 2022-08-29 DIAGNOSIS — N184 Chronic kidney disease, stage 4 (severe): Secondary | ICD-10-CM

## 2022-09-11 DIAGNOSIS — H10021 Other mucopurulent conjunctivitis, right eye: Secondary | ICD-10-CM | POA: Diagnosis not present

## 2022-09-25 DIAGNOSIS — M19041 Primary osteoarthritis, right hand: Secondary | ICD-10-CM | POA: Diagnosis not present

## 2022-09-25 DIAGNOSIS — M19049 Primary osteoarthritis, unspecified hand: Secondary | ICD-10-CM | POA: Diagnosis not present

## 2023-04-04 ENCOUNTER — Emergency Department: Payer: Medicare HMO

## 2023-04-04 ENCOUNTER — Emergency Department
Admission: EM | Admit: 2023-04-04 | Discharge: 2023-04-04 | Disposition: A | Discharge status: Home / Self Care | Payer: Medicare HMO | Attending: Emergency Medicine | Admitting: Emergency Medicine

## 2023-04-04 ENCOUNTER — Other Ambulatory Visit: Payer: Self-pay

## 2023-04-04 DIAGNOSIS — M25552 Pain in left hip: Secondary | ICD-10-CM | POA: Diagnosis not present

## 2023-04-04 DIAGNOSIS — M898X8 Other specified disorders of bone, other site: Secondary | ICD-10-CM | POA: Diagnosis not present

## 2023-04-04 DIAGNOSIS — M160 Bilateral primary osteoarthritis of hip: Secondary | ICD-10-CM | POA: Diagnosis not present

## 2023-04-04 DIAGNOSIS — I10 Essential (primary) hypertension: Secondary | ICD-10-CM | POA: Diagnosis not present

## 2023-04-04 MED ORDER — ACETAMINOPHEN 325 MG PO TABS
650.0000 mg | ORAL_TABLET | Freq: Once | ORAL | Status: AC
  Administered 2023-04-04: 650 mg via ORAL
  Filled 2023-04-04: qty 2

## 2023-04-04 NOTE — ED Provider Notes (Signed)
[facility] Provider Note    Event Date/Time   First MD Initiated Contact with Patient 04/04/23 1245     (approximate)   History   No chief complaint on file.   HPI  Adrian Blevins is a 77 y.o. male who presents today for evaluation of 3 days of left hip pain.  Patient denies any injuries.  He reports that he still able to ambulate.  He has not had any abdominal pain, dysuria, nausea, vomiting, diarrhea, fevers, or chills.  He denies back pain.  No paresthesias in his leg.  No previous injuries, no history of surgery to his hip.  Patient Active Problem List   Diagnosis Date Noted   NSTEMI (non-ST elevated myocardial infarction) (HCC) 08/02/2016   Chest pain 08/01/2016   Accelerated hypertension 08/01/2016   GERD (gastroesophageal reflux disease) 08/01/2016   AKI (acute kidney injury) (HCC) 08/01/2016          Physical Exam   Triage Vital Signs: ED Triage Vitals [04/04/23 1211]  Encounter Vitals Group     BP (!) 192/95     Systolic BP Percentile      Diastolic BP Percentile      Pulse Rate 97     Resp 19     Temp 98.3 F (36.8 C)     Temp src      SpO2 98 %     Weight 175 lb (79.4 kg)     Height 5\' 8"  (1.727 m)     Head Circumference      Peak Flow      Pain Score 8     Pain Loc      Pain Education      Exclude from Growth Chart     Most recent vital signs: Vitals:   04/04/23 1211 04/04/23 1213  BP: (!) 192/95   Pulse: 97   Resp: 19   Temp: 98.3 F (36.8 C)   SpO2: 98% 97%    Physical Exam Vitals and nursing note reviewed.  Constitutional:      General: Awake and alert. No acute distress.    Appearance: Normal appearance. The patient is normal weight.  HENT:     Head: Normocephalic and atraumatic.     Mouth: Mucous membranes are moist.  Eyes:     General: PERRL. Normal EOMs        Right eye: No discharge.        Left eye: No discharge.     Conjunctiva/sclera: Conjunctivae normal.  Cardiovascular:     Rate and  Rhythm: Normal rate and regular rhythm.     Pulses: Normal pulses.  Pulmonary:     Effort: Pulmonary effort is normal. No respiratory distress.     Breath sounds: Normal breath sounds.  Abdominal:     Abdomen is soft. There is no abdominal tenderness. No rebound or guarding. No distention. Musculoskeletal:        General: No swelling. Normal range of motion.     Cervical back: Normal range of motion and neck supple.  Tenderness to palpation over left greater trochanter.  Able to flex, extend, externally rotate, and internally rotate hip against resistance.  Normal extension of bilateral knees and ankles.  No paresthesias or weakness in legs.  No vertebral tenderness.  No skin changes noted. Skin:    General: Skin is warm and dry.     Capillary Refill: Capillary refill takes less than 2 seconds.     Findings: No  rash.  Neurological:     Mental Status: The patient is awake and alert.      ED Results / Procedures / Treatments   Labs (all labs ordered are listed, but only abnormal results are displayed) Labs Reviewed - No data to display   EKG     RADIOLOGY I independently reviewed and interpreted imaging.     PROCEDURES:  Critical Care performed:   Procedures   MEDICATIONS ORDERED IN ED: Medications  acetaminophen (TYLENOL) tablet 650 mg (650 mg Oral Given 04/04/23 1338)     IMPRESSION / MDM / ASSESSMENT AND PLAN / ED COURSE  I reviewed the triage vital signs and the nursing notes.   Differential diagnosis includes, but is not limited to, osteoarthritis, bursitis, less likely fracture or dislocation.  Patient is awake and alert, hemodynamically stable and afebrile.  He is neurovascularly intact.  He has full and normal active and passive range of motion with flexion, extension, internal, and external rotation of bilateral hips.  He has tenderness to palpation over his greater trochanter.  X-ray obtained in triage does not reveal any evidence of bony injury per my  independent interpretation.  He is able to ambulate.  He has absolutely no abdominal tenderness to suggest abdominal etiology.  No tenderness over his buttock or psoas area.  No constitutional symptoms to suggest infectious etiology.  No vertebral tenderness on exam.  He was treated symptomatically with Tylenol with improvement of his symptoms.    Patient does not wish to wait for the results of his x-ray.  He was advised that he may find the results on MyChart.  Recommended close outpatient follow-up with orthopedics.  We discussed return precautions.  Patient understands and agrees with plan.  Discharged in stable condition with his wife.   Patient's presentation is most consistent with acute complicated illness / injury requiring diagnostic workup.    FINAL CLINICAL IMPRESSION(S) / ED DIAGNOSES   Final diagnoses:  Left hip pain     Rx / DC Orders   ED Discharge Orders     None        Note:  This document was prepared using Dragon voice recognition software and may include unintentional dictation errors.   Jackelyn Hoehn, PA-C 04/04/23 1506    Jene Every, MD 04/04/23 [phone]

## 2023-04-04 NOTE — ED Notes (Signed)
See triage notes. Patient c/o left hip pain for the past two or three days. Patient denies injury

## 2023-04-04 NOTE — Discharge Instructions (Signed)
He did not wish to wait for the results of your x-ray of your hip.  You will be notified if this is abnormal.  You may also find the results on MyChart.  Please follow-up with orthopedics.  Please return for any new, worsening, or change in symptoms or other concerns.  It was a pleasure caring for you today.

## 2023-04-04 NOTE — ED Triage Notes (Signed)
PT arrives via [facility] from home. Pt reports left hip pain/groin pain for the past 2-3 days. Pt denies any injury. Also denies any other symptoms. Pt AxOx4. NAD

## 2023-04-05 ENCOUNTER — Telehealth: Payer: Self-pay

## 2023-04-05 NOTE — Telephone Encounter (Signed)
Wife calling for results from ED yesterday. Instructed they will contact pt. Verbalizes understanding.

## 2023-04-09 DIAGNOSIS — M7062 Trochanteric bursitis, left hip: Secondary | ICD-10-CM | POA: Diagnosis not present

## 2023-04-09 DIAGNOSIS — N189 Chronic kidney disease, unspecified: Secondary | ICD-10-CM | POA: Diagnosis not present

## 2023-04-26 DIAGNOSIS — N189 Chronic kidney disease, unspecified: Secondary | ICD-10-CM | POA: Diagnosis not present

## 2023-04-26 DIAGNOSIS — M25552 Pain in left hip: Secondary | ICD-10-CM | POA: Diagnosis not present

## 2023-06-04 DIAGNOSIS — H6123 Impacted cerumen, bilateral: Secondary | ICD-10-CM | POA: Diagnosis not present

## 2023-06-04 DIAGNOSIS — H903 Sensorineural hearing loss, bilateral: Secondary | ICD-10-CM | POA: Diagnosis not present

## 2023-10-08 DIAGNOSIS — Z961 Presence of intraocular lens: Secondary | ICD-10-CM | POA: Diagnosis not present

## 2023-10-12 DIAGNOSIS — H524 Presbyopia: Secondary | ICD-10-CM | POA: Diagnosis not present

## 2023-12-03 DIAGNOSIS — H6123 Impacted cerumen, bilateral: Secondary | ICD-10-CM | POA: Diagnosis not present

## 2023-12-03 DIAGNOSIS — H903 Sensorineural hearing loss, bilateral: Secondary | ICD-10-CM | POA: Diagnosis not present

## 2024-02-05 ENCOUNTER — Emergency Department

## 2024-02-05 ENCOUNTER — Encounter: Payer: Self-pay | Admitting: Emergency Medicine

## 2024-02-05 ENCOUNTER — Other Ambulatory Visit: Payer: Self-pay

## 2024-02-05 ENCOUNTER — Emergency Department
Admission: EM | Admit: 2024-02-05 | Discharge: 2024-02-05 | Disposition: A | Discharge status: Home / Self Care | Attending: Emergency Medicine | Admitting: Emergency Medicine

## 2024-02-05 DIAGNOSIS — N189 Chronic kidney disease, unspecified: Secondary | ICD-10-CM | POA: Insufficient documentation

## 2024-02-05 DIAGNOSIS — I129 Hypertensive chronic kidney disease with stage 1 through stage 4 chronic kidney disease, or unspecified chronic kidney disease: Secondary | ICD-10-CM | POA: Diagnosis not present

## 2024-02-05 DIAGNOSIS — M8580 Other specified disorders of bone density and structure, unspecified site: Secondary | ICD-10-CM | POA: Diagnosis not present

## 2024-02-05 DIAGNOSIS — M160 Bilateral primary osteoarthritis of hip: Secondary | ICD-10-CM | POA: Diagnosis not present

## 2024-02-05 DIAGNOSIS — M25552 Pain in left hip: Secondary | ICD-10-CM | POA: Diagnosis not present

## 2024-02-05 DIAGNOSIS — M79652 Pain in left thigh: Secondary | ICD-10-CM | POA: Diagnosis not present

## 2024-02-05 DIAGNOSIS — M79605 Pain in left leg: Secondary | ICD-10-CM | POA: Diagnosis not present

## 2024-02-05 MED ORDER — ACETAMINOPHEN 325 MG PO TABS
650.0000 mg | ORAL_TABLET | Freq: Once | ORAL | Status: AC
  Administered 2024-02-05: 650 mg via ORAL
  Filled 2024-02-05: qty 2

## 2024-02-05 NOTE — ED Notes (Addendum)
 Patient to ultrasound at this time.

## 2024-02-05 NOTE — ED Triage Notes (Signed)
 Patient to ED via POV for left sided lower back pain. Pain radiates down left leg. Ongoing for 3 days.

## 2024-02-05 NOTE — Discharge Instructions (Addendum)
 You were seen in the emergency department for arthritis of your hip.  You may take either one 650 mg Tylenol  every 6 hours or two 500 mg Tylenol  every 8 hours to help with your pain.  Follow-up with your primary care today following today's visit.  Please also follow-up with the orthopedist (Dr. Edie) listed in this paperwork if you are not feeling better despite taking the Tylenol .  Return to the emergency department for any new, worsening or concerning symptoms.

## 2024-02-05 NOTE — ED Provider Notes (Signed)
 [facility] Provider Note    Event Date/Time   First MD Initiated Contact with Patient 02/05/24 1523     (approximate)   History   Back Pain   HPI  Adrian Blevins is a 78 y.o. male  with a past medical history of HTN, GERD, left nephrectomy, CKD presents to the emergency department with pain in his left buttocks that goes down into his left anterior thigh.  Patient states this has been ongoing for the past 3 days. Is unable to describe the pain, does not believe it is shooting or sharp. Patient is able to ambulate, lives with his wife. Patient denies dysuria, increased urinary frequency, hematuria, urinary retention, diarrhea, back pain, abdominal pain, vomiting, fever, chills, saddle anesthesia, IVDU, hx of cancer.  Patient has been using ibuprofen at home with minimal relief. No hx of DVT or PE. No fall or injury, no prior surgeries of his hip.  Physical Exam   Triage Vital Signs: ED Triage Vitals  Encounter Vitals Group     BP 02/05/24 1353 (!) 149/79     Girls Systolic BP Percentile --      Girls Diastolic BP Percentile --      Boys Systolic BP Percentile --      Boys Diastolic BP Percentile --      Pulse Rate 02/05/24 1353 (!) 106     Resp 02/05/24 1353 17     Temp 02/05/24 1353 98.1 F (36.7 C)     Temp Source 02/05/24 1353 Oral     SpO2 02/05/24 1353 95 %     Weight 02/05/24 1352 175 lb (79.4 kg)     Height 02/05/24 1352 5' 8 (1.727 m)     Head Circumference --      Peak Flow --      Pain Score 02/05/24 1352 8     Pain Loc --      Pain Education --      Exclude from Growth Chart --     Most recent vital signs: Vitals:   02/05/24 1353 02/05/24 1817  BP: (!) 149/79 (!) 155/99  Pulse: (!) 106 100  Resp: 17 16  Temp: 98.1 F (36.7 C) 98 F (36.7 C)  SpO2: 95% 96%    General: Awake, in no acute distress. Appears stated age. Head: Normocephalic, atraumatic. Eyes: No scleral icterus or conjunctival injection. Ears/Nose/Throat: TMs  intact b/l. Nares patent, no nasal discharge. Oropharynx moist, no erythema or exudate. Dentition intact. CV:  Good peripheral perfusion. Cap refill <2 sec. No b/l lower leg edema.  Respiratory:Normal respiratory effort.  No respiratory distress.  GI: Soft, non-distended, non-tender. No rebound or guarding. MSK: Normal ROM of bilateral hips and knees both passively and actively. Endorses pain with left hip internal rotation and adduction. TTP along right gluteal region and in anteromedial thigh. No midline lumbar tenderness. Ambulatory without assistance. Skin:Warm, dry, intact. No rash.  Neurological: A&Ox4 to person, place, time, and situation. Negative straight leg raise b/l.  ED Results / Procedures / Treatments   Labs (all labs ordered are listed, but only abnormal results are displayed) Labs Reviewed - No data to display   EKG     RADIOLOGY Left hip x-ray and left lower extremilty venous US  ordered.   PROCEDURES:  Critical Care performed: No   Procedures   MEDICATIONS ORDERED IN ED: Medications  acetaminophen  (TYLENOL ) tablet 650 mg (650 mg Oral Given 02/05/24 1635)     IMPRESSION / MDM /  ASSESSMENT AND PLAN / ED COURSE  I reviewed the triage vital signs and the nursing notes.                              Differential diagnosis includes, but is not limited to, hip osteoarthritis, lumbar radiculopathy/sciatica, greater trochanteric bursitis  Patient's presentation is most consistent with acute complicated illness / injury requiring diagnostic workup.  Patient is a 78 year old male with left hip pain x 3 days.  Patient is awake and alert, afebrile.  Patient is neurovascularly intact has full active and passive range of motion of his hips.  No red flag symptoms of back pain.  Patient has tenderness in his right gluteal region and extends into his anterior medial thigh worse with certain hip ranges of motion.  X-ray obtained without any acute findings, does show  bilateral hip minimal degenerative changes. I independently viewed the x-ray and radiologist's report.  I agree with the radiologist's report. Patient has no tenderness along his abdomen or in his mid-spine.  Patient was treated symptomatically with Tylenol  in the ER.  Reviewed some past labs with his CMP that showed consistency with CKD stage 4.  Discussed treatment with OTC Tylenol  at home.  Will have him follow-up with his PCP and with orthopedics outpatient.  The patient may return to the emergency department for any new, worsening, or concerning symptoms. Patient was given the opportunity to ask questions; all questions were answered. Emergency department return precautions were discussed with the patient.  Patient is in agreement to the treatment plan.  Patient is stable for discharge.      FINAL CLINICAL IMPRESSION(S) / ED DIAGNOSES   Final diagnoses:  Left hip pain     Rx / DC Orders   ED Discharge Orders     None        Note:  This document was prepared using Dragon voice recognition software and may include unintentional dictation errors.     Sheron Salm, PA-C 02/05/24 2025    Waymond Lorelle Cummins, MD 02/05/24 [phone]

## 2024-02-29 DIAGNOSIS — M5432 Sciatica, left side: Secondary | ICD-10-CM | POA: Diagnosis not present

## 2024-02-29 DIAGNOSIS — M9933 Osseous stenosis of neural canal of lumbar region: Secondary | ICD-10-CM | POA: Diagnosis not present

## 2024-03-10 DIAGNOSIS — R200 Anesthesia of skin: Secondary | ICD-10-CM | POA: Diagnosis not present

## 2024-03-10 DIAGNOSIS — M62541 Muscle wasting and atrophy, not elsewhere classified, right hand: Secondary | ICD-10-CM | POA: Diagnosis not present

## 2024-03-10 DIAGNOSIS — G5601 Carpal tunnel syndrome, right upper limb: Secondary | ICD-10-CM | POA: Diagnosis not present

## 2024-03-10 DIAGNOSIS — G5602 Carpal tunnel syndrome, left upper limb: Secondary | ICD-10-CM | POA: Diagnosis not present

## 2024-03-14 ENCOUNTER — Ambulatory Visit

## 2024-03-21 ENCOUNTER — Ambulatory Visit

## 2024-03-24 ENCOUNTER — Encounter

## 2024-03-28 ENCOUNTER — Encounter

## 2024-03-28 ENCOUNTER — Ambulatory Visit

## 2024-04-04 ENCOUNTER — Encounter

## 2024-04-04 ENCOUNTER — Ambulatory Visit

## 2024-04-11 ENCOUNTER — Encounter

## 2024-04-14 ENCOUNTER — Ambulatory Visit: Attending: Orthopedic Surgery

## 2024-04-14 ENCOUNTER — Encounter

## 2024-04-18 ENCOUNTER — Ambulatory Visit

## 2024-04-21 ENCOUNTER — Ambulatory Visit

## 2024-04-25 ENCOUNTER — Ambulatory Visit

## 2024-04-28 ENCOUNTER — Encounter

## 2024-05-05 ENCOUNTER — Encounter

## 2024-05-09 ENCOUNTER — Encounter

## 2024-05-12 ENCOUNTER — Encounter

## 2024-05-16 ENCOUNTER — Encounter

## 2024-05-19 ENCOUNTER — Encounter

## 2024-05-23 ENCOUNTER — Encounter

## 2024-05-26 ENCOUNTER — Encounter
